# Patient Record
Sex: Female | Born: 2000 | Race: White | Hispanic: No | Marital: Single | Smoking: Current some day smoker
Health system: Southern US, Community
[De-identification: ages and names within clinical notes are randomized; demographics above are authoritative.]

## PROBLEM LIST (undated history)

## (undated) DIAGNOSIS — T7840XA Allergy, unspecified, initial encounter: Secondary | ICD-10-CM

## (undated) DIAGNOSIS — K219 Gastro-esophageal reflux disease without esophagitis: Secondary | ICD-10-CM

## (undated) DIAGNOSIS — F419 Anxiety disorder, unspecified: Secondary | ICD-10-CM

## (undated) DIAGNOSIS — F429 Obsessive-compulsive disorder, unspecified: Secondary | ICD-10-CM

## (undated) DIAGNOSIS — F17210 Nicotine dependence, cigarettes, uncomplicated: Secondary | ICD-10-CM

## (undated) DIAGNOSIS — J45909 Unspecified asthma, uncomplicated: Secondary | ICD-10-CM

## (undated) DIAGNOSIS — F32A Depression, unspecified: Secondary | ICD-10-CM

## (undated) DIAGNOSIS — G8929 Other chronic pain: Secondary | ICD-10-CM

## (undated) DIAGNOSIS — N9489 Other specified conditions associated with female genital organs and menstrual cycle: Secondary | ICD-10-CM

## (undated) DIAGNOSIS — F1210 Cannabis abuse, uncomplicated: Secondary | ICD-10-CM

## (undated) DIAGNOSIS — F1410 Cocaine abuse, uncomplicated: Secondary | ICD-10-CM

## (undated) DIAGNOSIS — R102 Pelvic and perineal pain unspecified side: Secondary | ICD-10-CM

## (undated) DIAGNOSIS — Z9151 Personal history of suicidal behavior: Secondary | ICD-10-CM

## (undated) DIAGNOSIS — F329 Major depressive disorder, single episode, unspecified: Secondary | ICD-10-CM

## (undated) DIAGNOSIS — F909 Attention-deficit hyperactivity disorder, unspecified type: Secondary | ICD-10-CM

## (undated) HISTORY — DX: Anxiety disorder, unspecified: F41.9

## (undated) HISTORY — DX: Allergy, unspecified, initial encounter: T78.40XA

## (undated) HISTORY — PX: NO PAST SURGERIES: SHX2092

---
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
## 2022-05-24 ENCOUNTER — Ambulatory Visit
Admission: EM | Admit: 2022-05-24 | Discharge: 2022-05-24 | Disposition: A | Discharge status: Home / Self Care | Payer: No Typology Code available for payment source | Source: Ambulatory Visit | Attending: Emergency Medicine | Admitting: Emergency Medicine

## 2022-05-24 ENCOUNTER — Other Ambulatory Visit: Payer: Self-pay

## 2022-05-24 ENCOUNTER — Emergency Department
Admission: EM | Admit: 2022-05-24 | Discharge: 2022-05-24 | Disposition: A | Discharge status: Home / Self Care | Payer: Medicaid Other | Attending: Emergency Medicine | Admitting: Emergency Medicine

## 2022-05-24 ENCOUNTER — Encounter: Payer: Self-pay | Admitting: Emergency Medicine

## 2022-05-24 DIAGNOSIS — T7421XA Adult sexual abuse, confirmed, initial encounter: Secondary | ICD-10-CM | POA: Insufficient documentation

## 2022-05-24 DIAGNOSIS — Z0441 Encounter for examination and observation following alleged adult rape: Secondary | ICD-10-CM | POA: Diagnosis present

## 2022-05-24 DIAGNOSIS — R000 Tachycardia, unspecified: Secondary | ICD-10-CM | POA: Diagnosis not present

## 2022-05-24 LAB — POC URINE PREG, ED: Preg Test, Ur: NEGATIVE

## 2022-05-24 MED ORDER — ULIPRISTAL ACETATE 30 MG PO TABS
30.0000 mg | ORAL_TABLET | Freq: Once | ORAL | Status: AC
  Administered 2022-05-24: 30 mg via ORAL
  Filled 2022-05-24: qty 1

## 2022-05-24 NOTE — ED Notes (Signed)
Patient's grandmother is at bedside. Patient given hospital discharge instructions and has signed discharge with SANE RN.
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
## 2022-05-24 NOTE — ED Notes (Addendum)
Medication given to and administered by Murrell Converse, SANE RN.
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
## 2022-07-16 LAB — OB RESULTS CONSOLE GC/CHLAMYDIA: Chlamydia: NEGATIVE

## 2022-08-28 ENCOUNTER — Other Ambulatory Visit: Payer: Self-pay

## 2022-08-28 ENCOUNTER — Emergency Department
Admission: EM | Admit: 2022-08-28 | Discharge: 2022-08-28 | Disposition: A | Discharge status: Home / Self Care | Payer: Medicaid Other | Attending: Emergency Medicine | Admitting: Emergency Medicine

## 2022-08-28 DIAGNOSIS — R109 Unspecified abdominal pain: Secondary | ICD-10-CM | POA: Diagnosis present

## 2022-08-28 DIAGNOSIS — Z5321 Procedure and treatment not carried out due to patient leaving prior to being seen by health care provider: Secondary | ICD-10-CM | POA: Diagnosis not present

## 2022-08-28 LAB — COMPREHENSIVE METABOLIC PANEL
ALT: 19 U/L (ref 0–44)
AST: 23 U/L (ref 15–41)
Albumin: 4.2 g/dL (ref 3.5–5.0)
Alkaline Phosphatase: 80 U/L (ref 38–126)
Anion gap: 10 (ref 5–15)
BUN: 11 mg/dL (ref 6–20)
CO2: 21 mmol/L — ABNORMAL LOW (ref 22–32)
Calcium: 8.9 mg/dL (ref 8.9–10.3)
Chloride: 108 mmol/L (ref 98–111)
Creatinine, Ser: 0.64 mg/dL (ref 0.44–1.00)
GFR, Estimated: >60 mL/min (ref >60)
Glucose, Bld: 96 mg/dL (ref 70–99)
Potassium: 4.2 mmol/L (ref 3.5–5.1)
Sodium: 139 mmol/L (ref 135–145)
Total Bilirubin: 0.5 mg/dL (ref 0.3–1.2)
Total Protein: 8 g/dL (ref 6.5–8.1)

## 2022-08-28 LAB — CBC
HCT: 42.2 % (ref 36.0–46.0)
Hemoglobin: 13.4 g/dL (ref 12.0–15.0)
MCH: 27.2 pg (ref 26.0–34.0)
MCHC: 31.8 g/dL (ref 30.0–36.0)
MCV: 85.6 fL (ref 80.0–100.0)
Platelets: 280 10*3/uL (ref 150–400)
RBC: 4.93 MIL/uL (ref 3.87–5.11)
RDW: 13.3 % (ref 11.5–15.5)
WBC: 6.3 10*3/uL (ref 4.0–10.5)
nRBC: 0 % (ref 0.0–0.2)

## 2022-08-28 LAB — URINALYSIS, ROUTINE W REFLEX MICROSCOPIC
Bilirubin Urine: NEGATIVE
Glucose, UA: NEGATIVE mg/dL
Hgb urine dipstick: NEGATIVE
Ketones, ur: NEGATIVE mg/dL
Leukocytes,Ua: NEGATIVE
Nitrite: NEGATIVE
Protein, ur: NEGATIVE mg/dL
Specific Gravity, Urine: 1.024 (ref 1.005–1.030)
pH: 5 (ref 5.0–8.0)

## 2022-08-28 LAB — POC URINE PREG, ED: Preg Test, Ur: NEGATIVE

## 2022-08-28 LAB — LIPASE, BLOOD: Lipase: 40 U/L (ref 11–51)

## 2022-08-28 NOTE — ED Notes (Signed)
Patient is refusing labs. Patient states "she is scared to get her blood drawn." & that she is tired. RN notified along with PA.

## 2022-08-28 NOTE — ED Provider Triage Note (Signed)
Emergency Medicine Provider Triage Evaluation Note  Karen Villa, a 21 y.o. female  was evaluated in triage.  Pt complains of gastric abdominal pain that started this morning.  Patient describes awakening at about 11 AM with some chest pressure abdominal discomfort.  She does admit to alcohol intake yesterday.  She denies any nausea or vomiting.  Review of Systems  Positive: Abd pain Negative: NVD  Physical Exam  BP 125/87 (BP Location: Left Arm)   Pulse 97   Temp 97.6 F (36.4 C) (Oral)   Resp 18   Ht 5\' 6"  (1.676 m)   Wt 68 kg   SpO2 100%   BMI 24.21 kg/m  Gen:   Awake, no distress  NAD Resp:  Normal effort CTA MSK:   Moves extremities without difficulty  Other:    Medical Decision Making  Medically screening exam initiated at 12:22 PM.  Appropriate orders placed.  Mariska L Rigsbee was informed that the remainder of the evaluation will be completed by another provider, this initial triage assessment does not replace that evaluation, and the importance of remaining in the ED until their evaluation is complete.  Patient to the ED for evaluation of abdominal pain with onset this morning.  Patient describes epigastric discomfort upon awakening.  She denies any frank nausea or vomiting.   [name], PA-C 08/28/22 1222
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
## 2023-01-16 ENCOUNTER — Emergency Department
Admission: EM | Admit: 2023-01-16 | Discharge: 2023-01-16 | Discharge status: Against Medical Advice | Payer: Medicaid Other | Attending: Emergency Medicine | Admitting: Emergency Medicine

## 2023-01-16 ENCOUNTER — Other Ambulatory Visit: Payer: Self-pay

## 2023-01-16 DIAGNOSIS — Z5321 Procedure and treatment not carried out due to patient leaving prior to being seen by health care provider: Secondary | ICD-10-CM | POA: Diagnosis not present

## 2023-01-16 DIAGNOSIS — R1032 Left lower quadrant pain: Secondary | ICD-10-CM | POA: Insufficient documentation

## 2023-01-16 LAB — CBC
HCT: 43.3 % (ref 36.0–46.0)
Hemoglobin: 14.1 g/dL (ref 12.0–15.0)
MCH: 27.7 pg (ref 26.0–34.0)
MCHC: 32.6 g/dL (ref 30.0–36.0)
MCV: 85.1 fL (ref 80.0–100.0)
Platelets: 241 10*3/uL (ref 150–400)
RBC: 5.09 MIL/uL (ref 3.87–5.11)
RDW: 13.3 % (ref 11.5–15.5)
WBC: 6.1 10*3/uL (ref 4.0–10.5)
nRBC: 0 % (ref 0.0–0.2)

## 2023-01-16 LAB — URINALYSIS, ROUTINE W REFLEX MICROSCOPIC
Bilirubin Urine: NEGATIVE
Glucose, UA: NEGATIVE mg/dL
Hgb urine dipstick: NEGATIVE
Ketones, ur: NEGATIVE mg/dL
Leukocytes,Ua: NEGATIVE
Nitrite: NEGATIVE
Protein, ur: NEGATIVE mg/dL
Specific Gravity, Urine: 1.021 (ref 1.005–1.030)
pH: 5 (ref 5.0–8.0)

## 2023-01-16 LAB — COMPREHENSIVE METABOLIC PANEL
ALT: 24 U/L (ref 0–44)
AST: 19 U/L (ref 15–41)
Albumin: 4.5 g/dL (ref 3.5–5.0)
Alkaline Phosphatase: 78 U/L (ref 38–126)
Anion gap: 7 (ref 5–15)
BUN: 8 mg/dL (ref 6–20)
CO2: 24 mmol/L (ref 22–32)
Calcium: 9.3 mg/dL (ref 8.9–10.3)
Chloride: 104 mmol/L (ref 98–111)
Creatinine, Ser: 0.68 mg/dL (ref 0.44–1.00)
GFR, Estimated: >60 mL/min (ref >60)
Glucose, Bld: 93 mg/dL (ref 70–99)
Potassium: 3.9 mmol/L (ref 3.5–5.1)
Sodium: 135 mmol/L (ref 135–145)
Total Bilirubin: 0.7 mg/dL (ref 0.3–1.2)
Total Protein: 7.9 g/dL (ref 6.5–8.1)

## 2023-01-16 LAB — POC URINE PREG, ED: Preg Test, Ur: NEGATIVE

## 2023-01-16 LAB — LIPASE, BLOOD: Lipase: 34 U/L (ref 11–51)
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
## 2023-03-25 ENCOUNTER — Emergency Department
Admission: EM | Admit: 2023-03-25 | Discharge: 2023-03-25 | Discharge status: Against Medical Advice | Payer: MEDICAID | Attending: Emergency Medicine | Admitting: Emergency Medicine

## 2023-03-25 DIAGNOSIS — Z5321 Procedure and treatment not carried out due to patient leaving prior to being seen by health care provider: Secondary | ICD-10-CM | POA: Diagnosis present

## 2023-03-25 NOTE — ED Notes (Signed)
Went out to get pt to bring back to triage, Engineer, materials states pt walked out and called for a ride and left.

## 2023-03-27 ENCOUNTER — Emergency Department: Payer: MEDICAID

## 2023-03-27 ENCOUNTER — Encounter: Payer: Self-pay | Admitting: Emergency Medicine

## 2023-03-27 ENCOUNTER — Emergency Department
Admission: EM | Admit: 2023-03-27 | Discharge: 2023-03-27 | Disposition: A | Discharge status: Home / Self Care | Payer: MEDICAID | Attending: Emergency Medicine | Admitting: Emergency Medicine

## 2023-03-27 DIAGNOSIS — R112 Nausea with vomiting, unspecified: Secondary | ICD-10-CM | POA: Insufficient documentation

## 2023-03-27 DIAGNOSIS — R102 Pelvic and perineal pain: Secondary | ICD-10-CM | POA: Insufficient documentation

## 2023-03-27 DIAGNOSIS — N9489 Other specified conditions associated with female genital organs and menstrual cycle: Secondary | ICD-10-CM

## 2023-03-27 LAB — URINALYSIS, ROUTINE W REFLEX MICROSCOPIC
Bacteria, UA: NONE SEEN
Bilirubin Urine: NEGATIVE
Glucose, UA: NEGATIVE mg/dL
Ketones, ur: 5 mg/dL — AB
Leukocytes,Ua: NEGATIVE
Nitrite: NEGATIVE
Protein, ur: 30 mg/dL — AB
Specific Gravity, Urine: 1.041 — ABNORMAL HIGH (ref 1.005–1.030)
pH: 5 (ref 5.0–8.0)

## 2023-03-27 LAB — HCG, QUANTITATIVE, PREGNANCY: hCG, Beta Chain, Quant, S: <1 m[IU]/mL (ref <5)

## 2023-03-27 LAB — COMPREHENSIVE METABOLIC PANEL
ALT: 20 U/L (ref 0–44)
AST: 22 U/L (ref 15–41)
Albumin: 4.8 g/dL (ref 3.5–5.0)
Alkaline Phosphatase: 102 U/L (ref 38–126)
Anion gap: 11 (ref 5–15)
BUN: 12 mg/dL (ref 6–20)
CO2: 22 mmol/L (ref 22–32)
Calcium: 9.2 mg/dL (ref 8.9–10.3)
Chloride: 105 mmol/L (ref 98–111)
Creatinine, Ser: 0.68 mg/dL (ref 0.44–1.00)
GFR, Estimated: >60 mL/min (ref >60)
Glucose, Bld: 118 mg/dL — ABNORMAL HIGH (ref 70–99)
Potassium: 3.4 mmol/L — ABNORMAL LOW (ref 3.5–5.1)
Sodium: 138 mmol/L (ref 135–145)
Total Bilirubin: 0.5 mg/dL (ref 0.3–1.2)
Total Protein: 8.6 g/dL — ABNORMAL HIGH (ref 6.5–8.1)

## 2023-03-27 LAB — CBC
HCT: 42.5 % (ref 36.0–46.0)
Hemoglobin: 13.7 g/dL (ref 12.0–15.0)
MCH: 27.8 pg (ref 26.0–34.0)
MCHC: 32.2 g/dL (ref 30.0–36.0)
MCV: 86.4 fL (ref 80.0–100.0)
Platelets: 303 10*3/uL (ref 150–400)
RBC: 4.92 MIL/uL (ref 3.87–5.11)
RDW: 12.9 % (ref 11.5–15.5)
WBC: 12.1 10*3/uL — ABNORMAL HIGH (ref 4.0–10.5)
nRBC: 0 % (ref 0.0–0.2)

## 2023-03-27 LAB — LIPASE, BLOOD: Lipase: 32 U/L (ref 11–51)

## 2023-03-27 MED ORDER — ONDANSETRON HCL 4 MG/2ML IJ SOLN: 4.0000 mg | Freq: Once | INTRAMUSCULAR | Status: DC

## 2023-03-27 MED ORDER — DROPERIDOL 2.5 MG/ML IJ SOLN
2.5000 mg | Freq: Once | INTRAMUSCULAR | Status: DC
  Filled 2023-03-27: qty 2

## 2023-03-27 MED ORDER — HYDROMORPHONE HCL 1 MG/ML IJ SOLN: 1.0000 mg | Freq: Once | INTRAMUSCULAR | Status: DC

## 2023-03-27 MED ORDER — OXYCODONE-ACETAMINOPHEN 5-325 MG PO TABS
1.0000 | ORAL_TABLET | Freq: Once | ORAL | Status: AC
  Administered 2023-03-27: 1 via ORAL
  Filled 2023-03-27: qty 1

## 2023-03-27 MED ORDER — ONDANSETRON 4 MG PO TBDP
4.0000 mg | ORAL_TABLET | Freq: Once | ORAL | Status: DC
  Filled 2023-03-27: qty 1

## 2023-03-27 MED ORDER — SODIUM CHLORIDE 0.9 % IV BOLUS: 1000.0000 mL | Freq: Once | INTRAVENOUS | Status: DC

## 2023-03-27 NOTE — ED Provider Notes (Signed)
[facility] Provider Note    Event Date/Time   First MD Initiated Contact with Patient 03/27/23 [phone]     (approximate)   History   Groin Pain and Abdominal Pain   HPI  Karen Villa is a 22 y.o. female brought to the ED via EMS from home with a chief complaint of left adnexal pain.  Patient reports pelvic pain monthly with her menstrual cycles.  Started her period on 03/25/2023.  Endorses associated nausea and vomiting.  Denies fever/chills, chest pain, shortness of breath, dysuria or diarrhea.  Denies prior workup with GYN.     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 No pertinent surgical history.   Home Medications   Prior to Admission medications   Medication Sig Start Date End Date Taking? Authorizing Provider  busPIRone (BUSPAR) 5 MG tablet Take 1 tablet (5 mg total) 2 (two) times daily by mouth. Patient not taking: Reported on 07/15/2020 08/08/17   Denzil Magnuson, NP  dicyclomine (BENTYL) 10 MG capsule Take 1 capsule (10 mg total) by mouth 3 (three) times daily as needed for up to 14 days (abdominal pain). 04/14/21 04/28/21  Phineas Semen, MD  loratadine (CLARITIN) 10 MG tablet Take 10 mg daily as needed by mouth for allergies. Patient not taking: Reported on 07/15/2020    [provider]  MELATONIN PO Take 1 tablet as needed by mouth (sleep). Patient not taking: Reported on 07/15/2020    [provider]  NATROBA 0.9 % SUSP Apply topically. 06/13/20    [provider]  sertraline (ZOLOFT) 50 MG tablet Take 1 tablet (50 mg total) daily by mouth. Patient not taking: Reported on 07/15/2020 08/08/17   Denzil Magnuson, NP     Allergies  Other, Shrimp [shellfish allergy], Blueberry flavor, and Albuterol   Family History   Family History  Problem Relation Age of Onset   Drug abuse Mother    Mental illness Mother    Mental illness Father    Mental illness Paternal Grandmother      Physical Exam  Triage Vital Signs: ED Triage Vitals  Enc Vitals Group     BP 03/27/23 0458 (!) 141/91     Pulse Rate 03/27/23 0458 (!) 104     Resp 03/27/23 0458 (!) 22     Temp 03/27/23 0458 98.2 F (36.8 C)     Temp Source 03/27/23 0458 Oral     SpO2 03/27/23 0458 99 %     Weight 03/27/23 0459 154 lb 5.2 oz (70 kg)     Height 03/27/23 0459 5\' 6"  (1.676 m)     Head Circumference --      Peak Flow --      Pain Score 03/27/23 0459 10     Pain Loc --      Pain Edu? --      Excl. in GC? --     Updated Vital Signs: BP (!) 141/91 (BP Location: Left Arm)  Pulse (!) 104   Temp 98.2 F (36.8 C) (Oral)   Resp (!) 22   Ht 5\' 6"  (1.676 m)   Wt 70 kg   SpO2 99%   BMI 24.91 kg/m    General: Awake, moderate distress.  Screaming in pain but distractible on interview and examination. CV:  RRR. Good peripheral perfusion.  Resp:  Normal effort. CTAB. Abd:  MIld left pelvic tenderness to palpation without rebound or guarding.  No distention.  Other:  No truncal vesicles.   ED Results / Procedures / Treatments  Labs (all labs ordered are listed, but only abnormal results are displayed) Labs Reviewed  COMPREHENSIVE METABOLIC PANEL - Abnormal; Notable for the following components:      Result Value   Potassium 3.4 (*)    Glucose, Bld 118 (*)    Total Protein 8.6 (*)    All other components within normal limits  CBC - Abnormal; Notable for the following components:   WBC 12.1 (*)    All other components within normal limits   URINALYSIS, ROUTINE W REFLEX MICROSCOPIC - Abnormal; Notable for the following components:   Color, Urine YELLOW (*)    APPearance HAZY (*)    Specific Gravity, Urine 1.041 (*)    Hgb urine dipstick LARGE (*)    Ketones, ur 5 (*)    Protein, ur 30 (*)    All other components within normal limits  LIPASE, BLOOD  POC URINE PREG, ED     EKG  None   RADIOLOGY Ultrasound is pending   Official radiology report(s): No results found.   PROCEDURES:  Critical Care performed: No  Procedures   MEDICATIONS ORDERED IN ED: Medications  sodium chloride 0.9 % bolus 1,000 mL (1,000 mLs Intravenous Patient Refused/Not Given 03/27/23 0630)  droperidol (INAPSINE) 2.5 MG/ML injection 2.5 mg (2.5 mg Intravenous Patient Refused/Not Given 03/27/23 0631)  oxyCODONE-acetaminophen (PERCOCET/ROXICET) 5-325 MG per tablet 1 tablet (has no administration in time range)     IMPRESSION / MDM / ASSESSMENT AND PLAN / ED COURSE  I reviewed the triage vital signs and the nursing notes.                             22 year old female presenting with left pelvic pain. Differential diagnosis includes, but is not limited to, ovarian cyst, ovarian torsion, acute appendicitis, diverticulitis, urinary tract infection/pyelonephritis, endometriosis, bowel obstruction, colitis, renal colic, gastroenteritis, hernia, fibroids, endometriosis, pregnancy related pain including ectopic pregnancy, etc. I have personally reviewed patient's records and note multiple ED visits for similar complaints and an urgent care visit from 09/25/2021 for vaginitis and STD screening.  Patient's presentation is most consistent with acute complicated illness / injury requiring diagnostic workup.  Will obtain lab work, pelvic ultrasound to evaluate for torsion.  Initiate IV fluid resuscitation, IV droperidol and reassess.  Clinical Course as of 03/27/23 0709  Thu Mar 27, 2023  0703 POCT negative.  Laboratory results largely unremarkable.   Patient currently in ultrasound.  Of note, patient refused IV and requested "Oxy". Care transferred to Dr. Rosalia Hammers at change of shift pending [place] results. Anticipate patient may be discharged home if ultrasound is unremarkable. [JS]    Clinical Course User Index [JS] Irean Hong, MD     FINAL CLINICAL IMPRESSION(S) / ED DIAGNOSES   Final diagnoses:  Pelvic pain in female     Rx / DC Orders   ED Discharge Orders  None        Note:  This document was prepared using Dragon voice recognition software and may include unintentional dictation errors.   Irean Hong, MD 03/27/23 [phone]

## 2023-03-27 NOTE — ED Notes (Signed)
POCT preg is negative

## 2023-03-27 NOTE — Discharge Instructions (Addendum)
You may take Tylenol and Ibuprofen as needed for pain.  Please arrange follow-up with OBGYN for further evaluation of your pain and to discuss your ultrasound findings. Return to the ER for worsening symptoms, persistent vomiting, difficulty breathing or other concerns.  Pelvic Ultrasound Interpretation:  IMPRESSION:  1. Indeterminate left adnexal mass measuring up to 2.1 cm. The  patient reportedly has a negative pregnancy test. Recommend  correlation with serum beta HCG to exclude the possibility of  underlying ectopic pregnancy. Assuming a negative beta HCG recommend  follow-up imaging in 3 months to ensure resolution.  2. No evidence of ovarian torsion.

## 2023-03-27 NOTE — ED Provider Notes (Signed)
Care of this patient assumed from prior physician at 0700 pending ultrasound, reevaluation, and disposition. Please see prior physician note for further details.  Briefly this is a 22 year old female who presents with left adnexal pain that has been ongoing for many years.  Has not seen gynecology as an outpatient for this.  Blood work overall reassuring here.  Negative UPT.  I was notified by radiology regarding her ultrasound as below.  This did demonstrate a left adnexal mass distinct from patient's ovary with concern for possible ectopic pregnancy.  Beta-hCG was obtained to for the rule out ectopic pregnancy which fortunately did return negative.  Patient was updated on the results of her workup.  Given longstanding nature of pain and absence of torsion on ultrasound, do think she is stable for outpatient follow-up.  She was given follow-up information with gynecology.  Strict return precautions were provided.  She was discharged in stable condition.   Clinical Course as of 03/27/23 0833  Thu Mar 27, 2023  1610 POCT negative.  Laboratory results largely unremarkable.  Patient currently in ultrasound.  Of note, patient refused IV and requested "Oxy". Care transferred to Dr. Rosalia Hammers at change of shift pending [place] results. Anticipate patient may be discharged home if ultrasound is unremarkable. [JS]  [id number] Notified by radiology that patient's pelvic ultrasound demonstrated a left adnexal mass distinct from her ovary.  He recommended to beta-hCG for further evaluation despite negative UPT for further confirmation that this is not an ectopic pregnancy.  This is been added onto prior blood work.  If this is negative, will plan for GYN follow-up, radiology recommends a 6-month repeat scan. [NR]    Clinical Course User Index [JS] Irean Hong, MD [NR] Trinna Post, MD      Trinna Post, MD 03/27/23 [phone]

## 2023-03-27 NOTE — ED Triage Notes (Signed)
Pt arrived via [facility] with c/o left lower abdominal pain that pt reports happens intermittently with menstrual cycles. Pt currently on cycle that began 7/2. N/V accompanying pain.

## 2023-04-22 ENCOUNTER — Ambulatory Visit (INDEPENDENT_AMBULATORY_CARE_PROVIDER_SITE_OTHER): Payer: MEDICAID | Admitting: Licensed Practical Nurse

## 2023-04-22 VITALS — BP 131/88 | HR 91 | Wt 151.3 lb

## 2023-04-22 DIAGNOSIS — Q505 Embryonic cyst of broad ligament: Secondary | ICD-10-CM

## 2023-04-22 NOTE — Progress Notes (Signed)
Here for ER follow up  Was seen on 7/4 for adnexal pain. An US showed: 1. Indeterminate left adnexal mass measuring up to 2.1 cm. The patient reportedly has a negative pregnancy test. Recommend correlation with serum beta HCG to exclude the possibility of underlying ectopic pregnancy. Assuming a negative beta HCG recommend follow-up imaging in 3 months to ensure resolution. 2. No evidence of ovarian torsion.    She was given pain medication and told to follow up with GYN  Niylah reports she has had this pain on and off for the last 2 years, the pain occurs about 5-10 hours after her period stops. It will last 24 hours and is severe. The pain tends to be on her Left or Right lower abdomen . She has tried Motrin or Aleve, and Midol. Pt cannot remember which medication she has actually taken she knows she "takes more than she should". She has been to the ED for pain management about 5 times, sometimes there is cyst seen on [place] other times nothing is seen. She will be given Percocet and pain improves. Prachi would like to know if she can have a script for something for when the pain comes back,   She is sexually active. She does not desire pregnancy. She is not doing anything to prevent pregnancy. She is resistant to medications. She "just does not like taking them".   She was recommended OCP's in the ED and does not want to take them.  She does smoke 5-10 cigarettes a week  Denies hx of CHTN or migraines  OBJECTIVE: BP 131/88   Pulse 91   Wt 151 lb 4.8 oz (68.6 kg)   BMI 24.42 kg/m  GEN: NAD, pt speaking in clear sentences but averting  eye contact and  PE not necessary for purpose of exam.  ASSESSMENT: ER follow up  Paraovarian cyst   PLAN:  Reviewed to decrease the occurrence of cysts hormonally treatment with  OCP with recommended, offered samples, reviewed risks of tobacco use and VTE, pt declines OCP.  Reviewed all options for contraception including non hormonal options. Encouraged  consistent condom use.   Pt may consider herbal remedies   If this pain were to occur again, encouraged pt to call office to be seen. This CNM in agreement with the ED's recommendation to not have a standing order for narcotics for pain.   Carie Caddy, CNM  [facility]  04/25/23  3:34 PM

## 2023-04-25 ENCOUNTER — Encounter: Payer: Self-pay | Admitting: Licensed Practical Nurse

## 2023-04-25 ENCOUNTER — Telehealth: Payer: Self-pay

## 2023-04-25 NOTE — Telephone Encounter (Signed)
Karen Villa, pt's grandmother, calling; has concerns; do not want to break any HIPA rules but needs to voice her concerns.  Karen Villa is an emergency contact; no DPR on file; she is a retired Engineer, civil (consulting).  She brought pt in earlier this week for ovarian pain and endometriosis; pt told Karen Villa that CNM did not touch her or do anything; nothing was even ordered; just told her to come back if worsens.   Pt has been seen in ER in [place] and at [facility].  I Alden Hipp that if blood work and u/s was done recently that is why nothing was ordered here.  Karen Villa said pt saw an RN/CNM whose name started with an "A".  Actually LMD saw pt. Karen Villa feels the pt should have been seen by a doctor; is really really disappointed in [place] but knows we are a good practice.  She did mention she guessed they would have to go to [facility]. Karen Villa was appreciative of me listening to her.

## 2023-07-14 ENCOUNTER — Encounter: Payer: Self-pay | Admitting: *Deleted

## 2023-07-14 ENCOUNTER — Emergency Department
Admission: EM | Admit: 2023-07-14 | Discharge: 2023-07-14 | Discharge status: Against Medical Advice | Payer: MEDICAID | Attending: Emergency Medicine | Admitting: Emergency Medicine

## 2023-07-14 ENCOUNTER — Other Ambulatory Visit: Payer: Self-pay

## 2023-07-14 DIAGNOSIS — R062 Wheezing: Secondary | ICD-10-CM | POA: Diagnosis not present

## 2023-07-14 DIAGNOSIS — R0602 Shortness of breath: Secondary | ICD-10-CM | POA: Insufficient documentation

## 2023-07-14 DIAGNOSIS — Z5321 Procedure and treatment not carried out due to patient leaving prior to being seen by health care provider: Secondary | ICD-10-CM | POA: Diagnosis not present

## 2023-07-14 NOTE — ED Notes (Signed)
Pt up to lobby desk "I think I am going to leave, its just causing a lot of arguments". Encouraged pt to f/u with a provider or return if she wishes.

## 2023-07-14 NOTE — ED Triage Notes (Signed)
Pt says she has had some SOB today. She says for about 5 months she has had bad allergies that only occur when she is in the current apartment she is in-at night, while she is asleep.  She says that she was wheezing, but that stopped once she left her apartment. Lung sounds clear, clear mucous cough.

## 2023-09-27 ENCOUNTER — Emergency Department
Admission: EM | Admit: 2023-09-27 | Discharge: 2023-09-27 | Disposition: A | Discharge status: Home / Self Care | Payer: MEDICAID | Attending: Emergency Medicine | Admitting: Emergency Medicine

## 2023-09-27 ENCOUNTER — Other Ambulatory Visit: Payer: Self-pay

## 2023-09-27 ENCOUNTER — Emergency Department: Payer: MEDICAID

## 2023-09-27 DIAGNOSIS — Z20822 Contact with and (suspected) exposure to covid-19: Secondary | ICD-10-CM | POA: Diagnosis not present

## 2023-09-27 DIAGNOSIS — J4521 Mild intermittent asthma with (acute) exacerbation: Secondary | ICD-10-CM | POA: Insufficient documentation

## 2023-09-27 DIAGNOSIS — R0602 Shortness of breath: Secondary | ICD-10-CM | POA: Diagnosis present

## 2023-09-27 LAB — RESP PANEL BY RT-PCR (RSV, FLU A&B, COVID)  RVPGX2
Influenza A by PCR: NEGATIVE
Influenza B by PCR: NEGATIVE
Resp Syncytial Virus by PCR: NEGATIVE
SARS Coronavirus 2 by RT PCR: NEGATIVE

## 2023-09-27 MED ORDER — LORATADINE 10 MG PO TABS: 10.0000 mg | ORAL_TABLET | Freq: Every day | ORAL | 3 refills | Status: DC | PRN

## 2023-09-27 MED ORDER — PREDNISONE 50 MG PO TABS: ORAL_TABLET | ORAL | 0 refills | Status: DC

## 2023-09-27 MED ORDER — ALBUTEROL SULFATE HFA 108 (90 BASE) MCG/ACT IN AERS: 2.0000 | INHALATION_SPRAY | Freq: Four times a day (QID) | RESPIRATORY_TRACT | 2 refills | Status: DC | PRN

## 2023-09-27 NOTE — ED Provider Notes (Signed)
 [facility] Provider Note    Event Date/Time   First MD Initiated Contact with Patient 09/27/23 [phone]     (approximate)   History   Shortness of Breath   HPI  Karen Villa is a 23 y.o. female with PMH of OCD, ADHD, depression, social anxiety and asthma presents for evaluation of shortness of breath that started last night.  Patient states that this happens at night a few times a month.  Sometimes she can take a warm bath or eat something hot and that helps her symptoms.  She is here today because it was not getting any better.  She has a history of asthma and used to use breathing treatments but has not done one in a very long time.  She denies URI symptoms including cough, congestion, sore throat and headache.      Physical Exam   Triage Vital Signs: ED Triage Vitals  Encounter Vitals Group     BP 09/27/23 0711 130/86     Systolic BP Percentile --      Diastolic BP Percentile --      Pulse Rate 09/27/23 0711 80     Resp 09/27/23 0711 20     Temp 09/27/23 0711 98 F (36.7 C)     Temp Source 09/27/23 0711 Oral     SpO2 09/27/23 0711 98 %     Weight --      Height --      Head Circumference --      Peak Flow --      Pain Score 09/27/23 0706 2     Pain Loc --      Pain Education --      Exclude from Growth Chart --     Most recent vital signs: Vitals:   09/27/23 0711  BP: 130/86  Pulse: 80  Resp: 20  Temp: 98 F (36.7 C)  SpO2: 98%   General: Awake, no distress.  CV:  Good peripheral perfusion.  RRR. Resp:  Normal effort.  Wheezing in right lower lung field.  Speaking in full sentences, tolerating secretions. Abd:  No distention.  Other:     ED Results / Procedures / Treatments   Labs (all labs ordered are listed, but only abnormal results are displayed) Labs Reviewed  RESP PANEL BY RT-PCR (RSV, FLU A&B, COVID)  [id number]   RADIOLOGY  Chest x-ray obtained, interpreted the images as well as reviewed the radiologist report which did  not show any acute cardiopulmonary abnormalities.   PROCEDURES:  Critical Care performed: No  Procedures   MEDICATIONS ORDERED IN ED: Medications - No data to display   IMPRESSION / MDM / ASSESSMENT AND PLAN / ED COURSE  I reviewed the triage vital signs and the nursing notes.                             23 year old female presents for evaluation of shortness of breath.  Vital signs are stable patient NAD on exam.  Differential diagnosis includes, but is not limited to, asthma exacerbation, flu, COVID, RSV, bronchitis.  Patient's presentation is most consistent with acute complicated illness / injury requiring diagnostic workup.  Respiratory panel was negative.  Chest x-ray was also negative.  Based on patient's history it sounds like she has mild intermittent  asthma.  She is not on any daily controller medications for this at this time and does not have a primary care provider.  I placed a referral for one as well as provided her with resources for offices to reach out to.  Patient has albuterol  listed in her allergies with the reaction being nausea and vomiting.  I believe benefits outweigh the risks so patient was sent an albuterol  inhaler.  I also gave her a few days of prednisone .  We discussed that if the albuterol  makes her nauseous or vomit to stop using it.  I emphasized the importance of getting established with a PCP so that she can be started on daily medications for this.  I also refilled her allergy medicine and we talked about how this could be a trigger.  She voiced understanding, all questions were answered and she was stable at discharge.     FINAL CLINICAL IMPRESSION(S) / ED DIAGNOSES   Final diagnoses:  Mild intermittent asthma with exacerbation     Rx / DC Orders   ED Discharge Orders          Ordered    albuterol  (VENTOLIN  HFA) 108 (90 Base) MCG/ACT inhaler  Every 6 hours PRN        09/27/23 0821    predniSONE  (DELTASONE ) 50 MG tablet         09/27/23 0821    loratadine  (CLARITIN ) 10 MG tablet  Daily PRN        09/27/23 0821    Ambulatory Referral to Primary Care (Establish Care)        09/27/23 9178             Note:  This document was prepared using Dragon voice recognition software and may include unintentional dictation errors.   Cleaster Tinnie LABOR, PA-C 09/27/23 9171    Bradler, Evan K, MD 09/27/23 1425

## 2023-09-27 NOTE — Discharge Instructions (Addendum)
 Take the claritin  daily.   Take the prednisone  once a day for 5 days. Use the albuterol  inhaler as needed. 2 puff every 6 hours. If you start to feel nauseous or vomit after using it, stop using it.   I have placed a referral for a primary care provider but there are also options below.   Please go to the following website to schedule new (and existing) patient appointments:   [email or web address]   The following is a list of primary care offices in the area who are accepting new patients at this time.  Please reach out to one of them directly and let them know you would like to schedule an appointment to follow up on an Emergency Department visit, and/or to establish a new primary care provider (PCP).  There are likely other primary care clinics in the are who are accepting new patients, but this is an excellent place to start:  [facility] Lead physician: Dr Jon Eva [address] [place], [place] [address] [phone]  [facility] Lead Physician: Dr Dorette Loron [address], [place], [place] [address] [phone]  [facility]  Lead Physician: Dr Duwaine Louder [address] [place], [place] [address] [phone]  [facility] Lead Physician: Dr Marolyn Officer [address] [place], [place] [address] [phone]  [facility] Primary Care & Sports Medicine at [facility] Lead Physician: Dr Leita Adie [address] [place], [place], [place] [address] [phone]

## 2023-09-27 NOTE — ED Triage Notes (Addendum)
 Pt to ED via POV from home. Pt ambulatory to triage. Pt reports SOB that started last pm and was still present this am but is not worse. Pt reports hx of asthma and anxiety. Pt denies CP. Pt reports did have emesis x1.

## 2023-11-27 ENCOUNTER — Ambulatory Visit: Payer: Self-pay

## 2023-11-27 NOTE — Telephone Encounter (Signed)
 Copied from CRM [id number]. Topic: Clinical - Red Word Triage >> Nov 27, 2023  4:30 PM Karen Villa wrote: Patient called in saying she has severe asthma   Chief Complaint: Difficulty breathing  Symptoms: Difficulty breathing  Frequency: Intermittent  Pertinent Negatives: Patient denies current shortness of breath  Disposition: Follow up with the ED or urgent care as needed until your new patient appointment  Additional Notes: Patient reports that she has been having increasingly frequent asthma attacks due to their pet dog. They report that they have been unable to re-home the dog and has been attempting to control her symptoms at home. Patient denies any current shortness of breath. Patient has a new patient appointment in April which has been added to the wait list in case an earlier appointment opens up. Patient has been advised to go to urgent care or the ED if needed. Patient verbalized understanding and agreement of this plan.    Reason for Disposition  [1] MILD longstanding difficulty breathing AND [2]  SAME as normal  Answer Assessment - Initial Assessment Questions 1. RESPIRATORY STATUS: "Describe your breathing?" (e.g., wheezing, shortness of breath, unable to speak, severe coughing)      Currently no difficulty  2. ONSET: "When did this breathing problem begin?"      Today  3. PATTERN "Does the difficult breathing come and go, or has it been constant since it started?"      Intermittent  4. SEVERITY: "How bad is your breathing?" (e.g., mild, moderate, severe)    - MILD: No SOB at rest, mild SOB with walking, speaks normally in sentences, can lie down, no retractions, pulse < 100.    - MODERATE: SOB at rest, SOB with minimal exertion and prefers to sit, cannot lie down flat, speaks in phrases, mild retractions, audible wheezing, pulse 100-120.    - SEVERE: Very SOB at rest, speaks in single words, struggling to breathe, sitting hunched forward, retractions, pulse > 120      Mild  5.  RECURRENT SYMPTOM: "Have you had difficulty breathing before?" If Yes, ask: "When was the last time?" and "What happened that time?"      Yes, history of asthma 6. CARDIAC HISTORY: "Do you have any history of heart disease?" (e.g., heart attack, angina, bypass surgery, angioplasty)      no 7. LUNG HISTORY: "Do you have any history of lung disease?"  (e.g., pulmonary embolus, asthma, emphysema)     Asthma  8. CAUSE: "What do you think is causing the breathing problem?"      New dog  9. OTHER SYMPTOMS: "Do you have any other symptoms? (e.g., dizziness, runny nose, cough, chest pain, fever)       No 11. PREGNANCY: "Is there any chance you are pregnant?" "When was your last menstrual period?"       No  Protocols used: Breathing Difficulty-A-AH

## 2023-12-30 ENCOUNTER — Encounter: Payer: Self-pay | Admitting: Physician Assistant

## 2023-12-30 ENCOUNTER — Ambulatory Visit (INDEPENDENT_AMBULATORY_CARE_PROVIDER_SITE_OTHER): Payer: MEDICAID | Admitting: Physician Assistant

## 2023-12-30 VITALS — BP 108/82 | HR 88 | Temp 97.9°F | Ht 66.0 in | Wt 162.0 lb

## 2023-12-30 DIAGNOSIS — J3081 Allergic rhinitis due to animal (cat) (dog) hair and dander: Secondary | ICD-10-CM | POA: Insufficient documentation

## 2023-12-30 DIAGNOSIS — J4540 Moderate persistent asthma, uncomplicated: Secondary | ICD-10-CM | POA: Insufficient documentation

## 2023-12-30 MED ORDER — MONTELUKAST SODIUM 10 MG PO TABS: 10.0000 mg | ORAL_TABLET | Freq: Every day | ORAL | 2 refills | Status: DC

## 2023-12-30 MED ORDER — FLUTICASONE-SALMETEROL 45-21 MCG/ACT IN AERO: 2.0000 | INHALATION_SPRAY | Freq: Two times a day (BID) | RESPIRATORY_TRACT | 12 refills | Status: DC

## 2023-12-30 MED ORDER — ALBUTEROL SULFATE HFA 108 (90 BASE) MCG/ACT IN AERS: 2.0000 | INHALATION_SPRAY | Freq: Four times a day (QID) | RESPIRATORY_TRACT | 11 refills | Status: DC | PRN

## 2023-12-30 NOTE — Patient Instructions (Signed)
-  It was a pleasure to see you today! Please review your visit summary for helpful information -I would encourage you to follow your care via MyChart where you can access lab results, notes, messages, and more -If you feel that we did a nice job today, please complete your after-visit survey and leave [place] a Google review! Your CMA today was Mariann Barter and your provider was Alvester Morin, PA-C, DMSc -Please return for follow-up in about 6 weeks

## 2023-12-30 NOTE — Assessment & Plan Note (Signed)
 Exacerbated by new dog. Patient aware that the foundation of allergy and asthma maintenance is trigger avoidance - will need to find a solution for the dog.   In the meantime, start maintenance inhaler. Also refilling albuterol for PRN use.   We discussed regularly grooming the dog and vacuuming 1-2 times per week. Advised to ensure her air filters have been replaced in the apartment.

## 2023-12-30 NOTE — Assessment & Plan Note (Signed)
 Montelukast might help some with both her allergies and asthma so we will start this. Continue oral antihistamine. Consider also intranasal corticosteroid.

## 2023-12-30 NOTE — Progress Notes (Signed)
 Date:  12/30/2023   Name:  Karen Villa   DOB:  09-Apr-2001   MRN:  [id number]   Chief Complaint: Establish Care, Asthma (No symptoms today ), and Allergies (alternates zyrtec, benadryl and Claritin)  Asthma This is a chronic problem. The current episode started more than 1 year ago. Episode frequency: comes and goes. Her past medical history is significant for asthma.   Karen Villa is a pleasant 23 year old female with a history of asthma who presents today to the clinic today to establish care.  Primary concern is addressing worsening asthma after adopting a husky.  She is quite certain that she is allergic to this dog's dander, but not her other dog.  She is trying to responsibly get rid of the dog, but struggling to find a shelter that can take it. She attempted to re-home it to another family but it was quickly returned to her.    She feels that her environmental allergies have been worse as well, perhaps as a result of the worsening baseline inflammation from the dog.  She has been alternating between antihistamines roughly every month, and has also been taking more than recommended.    She does not have a maintenance inhaler for her asthma, and uses albuterol 2 puffs about 2-3 times per day.  She says that before adopting the husky, her allergy and asthma symptoms were well-maintained with albuterol alone.    Medication list has been reviewed and updated.  Current Meds  Medication Sig   diphenhydrAMINE HCl (BENADRYL PO) Take by mouth.   fluticasone-salmeterol (ADVAIR HFA) 45-21 MCG/ACT inhaler Inhale 2 puffs into the lungs 2 (two) times daily. Always rinse mouth after each use.   MELATONIN PO Take 1 tablet by mouth as needed (sleep).   montelukast (SINGULAIR) 10 MG tablet Take 1 tablet (10 mg total) by mouth at bedtime.   [DISCONTINUED] albuterol (VENTOLIN HFA) 108 (90 Base) MCG/ACT inhaler Inhale 2 puffs into the lungs every 6 (six) hours as needed for wheezing or shortness of breath.    [DISCONTINUED] Cetirizine HCl (ZYRTEC PO) Take by mouth.   [DISCONTINUED] loratadine (CLARITIN) 10 MG tablet Take 1 tablet (10 mg total) by mouth daily as needed for allergies.     Review of Systems  Patient Active Problem List   Diagnosis Date Noted   Allergic rhinitis due to animal hair and dander 12/30/2023   Moderate persistent asthma without complication 12/30/2023   History of suicide attempt 03/17/2021   Depressive disorder 07/31/2018   Social anxiety disorder 07/28/2017    Allergies  Allergen Reactions   Other Other (See Comments)    Shrimp   Shrimp [Shellfish Allergy] Anaphylaxis   Blueberry Flavoring Agent (Non-Screening) Rash   Haloperidol Other (See Comments)    "I go crazy and feel restless for days"    Immunization History  Administered Date(s) Administered   HPV 9-valent 07/03/2015, 05/10/2016   Influenza,inj,Quad PF,6+ Mos 07/29/2017   Tdap 03/09/2014    History reviewed. No pertinent surgical history.  Social History   Tobacco Use   Smoking status: Some Days    Current packs/day: 0.25    Average packs/day: 0.3 packs/day for 6.3 years (1.6 ttl pk-yrs)    Types: Cigarettes    Start date: 2019   Smokeless tobacco: Never   Tobacco comments:    Pt stated "I smoke only like 5 times on the weekend."  Vaping Use   Vaping status: Never Used  Substance Use Topics   Alcohol use: Yes  Alcohol/week: 2.0 standard drinks of alcohol    Types: 1 Cans of beer, 1 Shots of liquor per week    Comment: ocassionally   Drug use: Not Currently    Types: Marijuana    Comment: acid    Family History  Problem Relation Age of Onset   Drug abuse Mother    Mental illness Mother    Mental illness Father    Hypertension Maternal Grandmother    Mental illness Paternal Grandmother         12/30/2023    8:32 AM  GAD 7 : Generalized Anxiety Score  Nervous, Anxious, on Edge 2  Control/stop worrying 0  Worry too much - different things 0  Trouble relaxing 0   Restless 0  Easily annoyed or irritable 2  Afraid - awful might happen 0  Total GAD 7 Score 4  Anxiety Difficulty Not difficult at all       12/30/2023    8:32 AM 12/30/2023    8:31 AM  Depression screen PHQ 2/9  Decreased Interest  0  Down, Depressed, Hopeless  0  PHQ - 2 Score  0  Altered sleeping 3   Tired, decreased energy 2   Change in appetite 0   Feeling bad or failure about yourself  0   Trouble concentrating 0   Moving slowly or fidgety/restless 0   Suicidal thoughts 0     BP Readings from Last 3 Encounters:  12/30/23 108/82  09/27/23 130/86  07/14/23 (!) 141/79    Wt Readings from Last 3 Encounters:  12/30/23 162 lb (73.5 kg)  07/14/23 150 lb (68 kg)  04/22/23 151 lb 4.8 oz (68.6 kg)    BP 108/82   Pulse 88   Temp 97.9 F (36.6 C)   Ht 5\' 6"  (1.676 m)   Wt 162 lb (73.5 kg)   SpO2 98%   BMI 26.15 kg/m   Physical Exam Vitals and nursing note reviewed.  Constitutional:      Appearance: Normal appearance.  Cardiovascular:     Rate and Rhythm: Normal rate and regular rhythm.     Heart sounds: No murmur heard.    No friction rub. No gallop.  Pulmonary:     Effort: Pulmonary effort is normal.     Breath sounds: Wheezing (mid-upper, bilateral, mild) present.  Abdominal:     General: There is no distension.  Musculoskeletal:        General: Normal range of motion.  Skin:    General: Skin is warm and dry.  Neurological:     Mental Status: She is alert and oriented to person, place, and time.     Gait: Gait is intact.  Psychiatric:        Mood and Affect: Mood and affect normal.     Recent Labs     Component Value Date/Time   NA 138 03/27/2023 0457   K 3.4 (L) 03/27/2023 0457   CL 105 03/27/2023 0457   CO2 22 03/27/2023 0457   GLUCOSE 118 (H) 03/27/2023 0457   BUN 12 03/27/2023 0457   CREATININE 0.68 03/27/2023 0457   CALCIUM 9.2 03/27/2023 0457   PROT 8.6 (H) 03/27/2023 0457   ALBUMIN 4.8 03/27/2023 0457   AST 22 03/27/2023 0457   ALT  20 03/27/2023 0457   ALKPHOS 102 03/27/2023 0457   BILITOT 0.5 03/27/2023 0457   GFRNONAA >60 03/27/2023 0457   GFRAA NOT CALCULATED 03/27/2020 0057    Lab Results  Component Value Date  WBC 12.1 (H) 03/27/2023   HGB 13.7 03/27/2023   HCT 42.5 03/27/2023   MCV 86.4 03/27/2023   PLT 303 03/27/2023   Lab Results  Component Value Date   HGBA1C 5.5 07/29/2017   Lab Results  Component Value Date   CHOL 154 07/30/2017   HDL 49 07/30/2017   LDLCALC 87 07/30/2017   TRIG 92 07/30/2017   CHOLHDL 3.1 07/30/2017   Lab Results  Component Value Date   TSH 2.395 07/29/2017     Assessment and Plan:  Moderate persistent asthma without complication Assessment & Plan: Exacerbated by new dog. Patient aware that the foundation of allergy and asthma maintenance is trigger avoidance - will need to find a solution for the dog.   In the meantime, start maintenance inhaler. Also refilling albuterol for PRN use.   We discussed regularly grooming the dog and vacuuming 1-2 times per week. Advised to ensure her air filters have been replaced in the apartment.   Orders: -     Albuterol Sulfate HFA; Inhale 2 puffs into the lungs every 6 (six) hours as needed for wheezing or shortness of breath.  Dispense: 8 g; Refill: 11 -     Montelukast Sodium; Take 1 tablet (10 mg total) by mouth at bedtime.  Dispense: 30 tablet; Refill: 2 -     Fluticasone-Salmeterol; Inhale 2 puffs into the lungs 2 (two) times daily. Always rinse mouth after each use.  Dispense: 1 each; Refill: 12  Allergic rhinitis due to animal hair and dander Assessment & Plan: Montelukast might help some with both her allergies and asthma so we will start this. Continue oral antihistamine. Consider also intranasal corticosteroid.   Orders: -     Montelukast Sodium; Take 1 tablet (10 mg total) by mouth at bedtime.  Dispense: 30 tablet; Refill: 2     Return in about 6 weeks (around 02/10/2024) for OV f/u chronic conditions.    Alvester Morin, PA-C, DMSc, Nutritionist [facility] Primary Care and Sports Medicine [facility] [phone]

## 2024-01-02 ENCOUNTER — Telehealth: Payer: Self-pay

## 2024-01-02 ENCOUNTER — Other Ambulatory Visit: Payer: Self-pay

## 2024-01-02 ENCOUNTER — Other Ambulatory Visit: Payer: Self-pay | Admitting: Physician Assistant

## 2024-01-02 DIAGNOSIS — J4540 Moderate persistent asthma, uncomplicated: Secondary | ICD-10-CM

## 2024-01-02 MED ORDER — VENTOLIN HFA 108 (90 BASE) MCG/ACT IN AERS: 2.0000 | INHALATION_SPRAY | Freq: Four times a day (QID) | RESPIRATORY_TRACT | 11 refills | Status: AC | PRN

## 2024-01-02 NOTE — Telephone Encounter (Signed)
 PA completed waiting on insurance approval.  Key: [id number]  [id number]

## 2024-01-02 NOTE — Telephone Encounter (Signed)
 Sent pt message on Mychart. PA needed for albuterol. Will complete.  KP Copied from CRM [id number]. Topic: Clinical - Prescription Issue >> Jan 01, 2024  3:41 PM Karen Villa wrote: Reason for CRM: Patient is calling because she states her inhalers are not over to her pharmacy. She said she was going to call back and check once more with them and if they don't have her prescription she will call back.

## 2024-01-02 NOTE — Telephone Encounter (Signed)
 PA not completed Dan sent in Ventolin.  KP

## 2024-02-08 ENCOUNTER — Emergency Department
Admission: EM | Admit: 2024-02-08 | Discharge: 2024-02-09 | Disposition: A | Discharge status: Home / Self Care | Payer: MEDICAID | Attending: Emergency Medicine | Admitting: Emergency Medicine

## 2024-02-08 ENCOUNTER — Other Ambulatory Visit: Payer: Self-pay

## 2024-02-08 DIAGNOSIS — R102 Pelvic and perineal pain: Secondary | ICD-10-CM | POA: Insufficient documentation

## 2024-02-08 LAB — BASIC METABOLIC PANEL WITH GFR
Anion gap: 8 (ref 5–15)
BUN: 12 mg/dL (ref 6–20)
CO2: 25 mmol/L (ref 22–32)
Calcium: 9.2 mg/dL (ref 8.9–10.3)
Chloride: 107 mmol/L (ref 98–111)
Creatinine, Ser: 0.76 mg/dL (ref 0.44–1.00)
GFR, Estimated: >60 mL/min (ref >60)
Glucose, Bld: 98 mg/dL (ref 70–99)
Potassium: 4 mmol/L (ref 3.5–5.1)
Sodium: 140 mmol/L (ref 135–145)

## 2024-02-08 LAB — CBC WITH DIFFERENTIAL/PLATELET
Abs Immature Granulocytes: 0.02 10*3/uL (ref 0.00–0.07)
Basophils Absolute: 0.1 10*3/uL (ref 0.0–0.1)
Basophils Relative: 1 %
Eosinophils Absolute: 0.5 10*3/uL (ref 0.0–0.5)
Eosinophils Relative: 4 %
HCT: 42 % (ref 36.0–46.0)
Hemoglobin: 13.7 g/dL (ref 12.0–15.0)
Immature Granulocytes: 0 %
Lymphocytes Relative: 26 %
Lymphs Abs: 2.8 10*3/uL (ref 0.7–4.0)
MCH: 27.6 pg (ref 26.0–34.0)
MCHC: 32.6 g/dL (ref 30.0–36.0)
MCV: 84.5 fL (ref 80.0–100.0)
Monocytes Absolute: 0.7 10*3/uL (ref 0.1–1.0)
Monocytes Relative: 7 %
Neutro Abs: 6.6 10*3/uL (ref 1.7–7.7)
Neutrophils Relative %: 62 %
Platelets: 278 10*3/uL (ref 150–400)
RBC: 4.97 MIL/uL (ref 3.87–5.11)
RDW: 13.1 % (ref 11.5–15.5)
WBC: 10.7 10*3/uL — ABNORMAL HIGH (ref 4.0–10.5)
nRBC: 0 % (ref 0.0–0.2)

## 2024-02-08 NOTE — ED Triage Notes (Signed)
 Pt repots left side groin pain, pt repots nausea and chills. Pt states this pain is normal before she is about to get her menstrual cycle, pt reports this time is worse. Pt currently denies vaginal bleeding.

## 2024-02-09 LAB — PREGNANCY, URINE: Preg Test, Ur: NEGATIVE

## 2024-02-09 LAB — URINALYSIS, ROUTINE W REFLEX MICROSCOPIC
Bilirubin Urine: NEGATIVE
Glucose, UA: NEGATIVE mg/dL
Hgb urine dipstick: NEGATIVE
Ketones, ur: NEGATIVE mg/dL
Leukocytes,Ua: NEGATIVE
Nitrite: NEGATIVE
Protein, ur: NEGATIVE mg/dL
Specific Gravity, Urine: 1.024 (ref 1.005–1.030)
pH: 5 (ref 5.0–8.0)

## 2024-02-09 MED ORDER — OXYCODONE HCL 5 MG PO TABS
5.0000 mg | ORAL_TABLET | ORAL | Status: AC
  Administered 2024-02-09: 5 mg via ORAL
  Filled 2024-02-09: qty 1

## 2024-02-09 MED ORDER — KETOROLAC TROMETHAMINE 15 MG/ML IJ SOLN
15.0000 mg | Freq: Once | INTRAMUSCULAR | Status: DC
  Filled 2024-02-09: qty 1

## 2024-02-09 MED ORDER — ACETAMINOPHEN 500 MG PO TABS
1000.0000 mg | ORAL_TABLET | Freq: Once | ORAL | Status: DC
  Filled 2024-02-09: qty 2

## 2024-02-09 NOTE — ED Provider Notes (Signed)
 [facility] Provider Note    Event Date/Time   First MD Initiated Contact with Patient 02/08/24 2342     (approximate)   History   Chief Complaint: Groin Pain   HPI  Karen Villa is a 23 y.o. female with a history of ADHD, depression who comes ED complaining of left lower quadrant abdominal pain today.  This is a recurrent issue which happens around her menstrual cycle.  No dysuria, no abnormal discharge.  No fever chest pain shortness of breath.  Has had multiple ED visits for similar symptoms in the past, negative workups previously.  Reports she was referred to gynecology, they have discussed doing a diagnostic laparoscopy with her, she has not followed up recently.       Past Medical History:  Diagnosis Date   ADHD    Allergy    Anxiety    Depression    OCD (obsessive compulsive disorder)     Current Outpatient Rx   Order #: [id number] Class: Historical Med   Order #: [id number] Class: Normal   Order #: [id number] Class: Historical Med   Order #: [id number] Class: Normal   Order #: [id number] Class: Normal    History reviewed. No pertinent surgical history.  Physical Exam   Triage Vital Signs: ED Triage Vitals  Encounter Vitals Group     BP 02/08/24 2134 (!) 131/96     Systolic BP Percentile --      Diastolic BP Percentile --      Pulse Rate 02/08/24 2134 95     Resp 02/08/24 2134 18     Temp 02/08/24 2134 98.6 F (37 C)     Temp Source 02/08/24 2134 Oral     SpO2 02/08/24 2134 100 %     Weight 02/08/24 2133 150 lb (68 kg)     Height 02/08/24 2133 5\' 6"  (1.676 m)     Head Circumference --      Peak Flow --      Pain Score 02/08/24 2133 10     Pain Loc --      Pain Education --      Exclude from Growth Chart --     Most recent vital signs: Vitals:   02/08/24 2134  BP: (!) 131/96  Pulse: 95  Resp: 18  Temp: 98.6 F (37 C)  SpO2: 100%    General: Awake, no distress.  CV:  Good peripheral perfusion.  Regular rate  rhythm Resp:  Normal effort.  Clear to auscultation Abd:  No distention.  Soft with mild suprapubic tenderness.  No CVA tenderness Other:  Occiput.  No rash   ED Results / Procedures / Treatments   Labs (all labs ordered are listed, but only abnormal results are displayed) Labs Reviewed  CBC WITH DIFFERENTIAL/PLATELET - Abnormal; Notable for the following components:      Result Value   WBC 10.7 (*)    All other components within normal limits  URINALYSIS, ROUTINE W REFLEX MICROSCOPIC - Abnormal; Notable for the following components:   Color, Urine YELLOW (*)    APPearance CLEAR (*)    All other components within normal limits  BASIC METABOLIC PANEL WITH GFR  PREGNANCY, URINE     EKG    RADIOLOGY    PROCEDURES:  Procedures   MEDICATIONS ORDERED IN ED: Medications  oxyCODONE  (Oxy IR/ROXICODONE ) immediate release tablet 5 mg (5 mg Oral Given 02/09/24 0159)     IMPRESSION / MDM / ASSESSMENT AND PLAN / ED COURSE  I  reviewed the triage vital signs and the nursing notes.  DDx: Cystitis, pregnancy, endometriosis, dysmenorrhea  Patient's presentation is most consistent with acute presentation with potential threat to life or bodily function.     Clinical Course as of 02/09/24 0349  Mon Feb 09, 2024  0013 Presents with severe pelvic cramping pain, has some suprapubic tenderness.  She has had multiple workups for the symptoms which are recurrent with her menstrual cycle.  Doubt PID TOA torsion.  She has had a visit with gynecology and has discussed diagnostic laparoscopy.  Symptoms are suggestive of endometriosis.  Labs are normal.  Will confirm pregnancy status, check UA.  NSAIDs for pain relief. [PS]  0139 Labs normal. Pt reports oxycodone  works best for her pain - will give a tablet, stable for DC to f/u with gyn. Has seen Dr. Nickey Barn office previously.  [PS]    Clinical Course User Index [PS] Jacquie Maudlin, MD     FINAL CLINICAL IMPRESSION(S) / ED  DIAGNOSES   Final diagnoses:  Pelvic pain in female     Rx / DC Orders   ED Discharge Orders     None        Note:  This document was prepared using Dragon voice recognition software and may include unintentional dictation errors.   Jacquie Maudlin, MD 02/09/24 [phone]

## 2024-02-10 ENCOUNTER — Ambulatory Visit (INDEPENDENT_AMBULATORY_CARE_PROVIDER_SITE_OTHER): Payer: MEDICAID | Admitting: Physician Assistant

## 2024-02-10 ENCOUNTER — Encounter: Payer: Self-pay | Admitting: Physician Assistant

## 2024-02-10 VITALS — BP 110/76 | HR 85 | Temp 98.3°F | Ht 66.0 in | Wt 167.0 lb

## 2024-02-10 DIAGNOSIS — J3081 Allergic rhinitis due to animal (cat) (dog) hair and dander: Secondary | ICD-10-CM | POA: Diagnosis not present

## 2024-02-10 DIAGNOSIS — R102 Pelvic and perineal pain unspecified side: Secondary | ICD-10-CM | POA: Insufficient documentation

## 2024-02-10 DIAGNOSIS — J4540 Moderate persistent asthma, uncomplicated: Secondary | ICD-10-CM

## 2024-02-10 MED ORDER — IBUPROFEN 800 MG PO TABS: 800.0000 mg | ORAL_TABLET | Freq: Three times a day (TID) | ORAL | 0 refills | Status: DC | PRN

## 2024-02-10 NOTE — Progress Notes (Signed)
 Date:  02/10/2024   Name:  Karen Villa   DOB:  09/23/2001   MRN:  [id number]   Chief Complaint: Medical Management of Chronic Issues  HPI Karen Villa presents for 6wk f/u on chronic conditions, namely asthma and allergies. She was not able to refill Advair last time due to insurance issue - we will follow up on this today. She has been using someone else's "red inhaler" in addition to her albuterol .  Has not tried montelukast  yet prescribed last visit.  Evaluated at ED on 02/08/24 for recurrent flare of abdominal/pelvic pain. Sounds like GYN is planning diagnostic laparoscopy to assess for endometriosis. Would like something to help with pain when this inevitably flares up again.    Medication list has been reviewed and updated.  Current Meds  Medication Sig   diphenhydrAMINE  HCl (BENADRYL  PO) Take by mouth.   ibuprofen  (ADVIL ) 800 MG tablet Take 1 tablet (800 mg total) by mouth every 8 (eight) hours as needed. Always take with food. Do not use with other NSAIDs (naproxen, aspirin, etc).   MELATONIN PO Take 1 tablet by mouth as needed (sleep).   montelukast  (SINGULAIR ) 10 MG tablet Take 1 tablet (10 mg total) by mouth at bedtime.   VENTOLIN  HFA 108 (90 Base) MCG/ACT inhaler Inhale 2 puffs into the lungs every 6 (six) hours as needed for wheezing or shortness of breath.     Review of Systems  Patient Active Problem List   Diagnosis Date Noted   Pelvic pain 02/10/2024   Allergic rhinitis due to animal hair and dander 12/30/2023   Moderate persistent asthma without complication 12/30/2023   History of suicide attempt 03/17/2021   Depressive disorder 07/31/2018   Social anxiety disorder 07/28/2017    Allergies  Allergen Reactions   Other Other (See Comments)    Shrimp   Shrimp [Shellfish Allergy] Anaphylaxis   Blueberry Flavoring Agent (Non-Screening) Rash   Haloperidol  Other (See Comments)    "I go crazy and feel restless for days"   Toradol  [Ketorolac  Tromethamine ] Itching     Immunization History  Administered Date(s) Administered   HPV 9-valent 07/03/2015, 05/10/2016   Influenza,inj,Quad PF,6+ Mos 07/29/2017   Tdap 03/09/2014    History reviewed. No pertinent surgical history.  Social History   Tobacco Use   Smoking status: Some Days    Current packs/day: 0.25    Average packs/day: 0.3 packs/day for 6.4 years (1.6 ttl pk-yrs)    Types: Cigarettes    Start date: 2019   Smokeless tobacco: Never   Tobacco comments:    Pt stated "I smoke only like 5 times on the weekend."  Vaping Use   Vaping status: Never Used  Substance Use Topics   Alcohol use: Yes    Alcohol/week: 2.0 standard drinks of alcohol    Types: 1 Cans of beer, 1 Shots of liquor per week    Comment: ocassionally   Drug use: Not Currently    Types: Marijuana    Comment: acid    Family History  Problem Relation Age of Onset   Drug abuse Mother    Mental illness Mother    Mental illness Father    Hypertension Maternal Grandmother    Mental illness Paternal Grandmother         02/10/2024   10:29 AM 12/30/2023    8:32 AM  GAD 7 : Generalized Anxiety Score  Nervous, Anxious, on Edge 0 2  Control/stop worrying 0 0  Worry too much - different things  0 0  Trouble relaxing 0 0  Restless 0 0  Easily annoyed or irritable 0 2  Afraid - awful might happen 0 0  Total GAD 7 Score 0 4  Anxiety Difficulty Not difficult at all Not difficult at all       02/10/2024   10:29 AM 12/30/2023    8:32 AM 12/30/2023    8:31 AM  Depression screen PHQ 2/9  Decreased Interest 0  0  Down, Depressed, Hopeless 0  0  PHQ - 2 Score 0  0  Altered sleeping  3   Tired, decreased energy  2   Change in appetite  0   Feeling bad or failure about yourself   0   Trouble concentrating  0   Moving slowly or fidgety/restless  0   Suicidal thoughts  0     BP Readings from Last 3 Encounters:  02/10/24 110/76  02/08/24 (!) 131/96  12/30/23 108/82    Wt Readings from Last 3 Encounters:  02/10/24 167  lb (75.8 kg)  02/08/24 150 lb (68 kg)  12/30/23 162 lb (73.5 kg)    BP 110/76   Pulse 85   Temp 98.3 F (36.8 C)   Ht 5\' 6"  (1.676 m)   Wt 167 lb (75.8 kg)   SpO2 97%   BMI 26.95 kg/m   Physical Exam Vitals and nursing note reviewed.  Constitutional:      Appearance: Normal appearance.  Cardiovascular:     Rate and Rhythm: Normal rate.  Pulmonary:     Effort: Pulmonary effort is normal.  Abdominal:     General: There is no distension.  Musculoskeletal:        General: Normal range of motion.  Skin:    General: Skin is warm and dry.  Neurological:     Mental Status: She is alert and oriented to person, place, and time.     Gait: Gait is intact.  Psychiatric:        Mood and Affect: Mood and affect normal.     Recent Labs     Component Value Date/Time   NA 140 02/08/2024 2135   K 4.0 02/08/2024 2135   CL 107 02/08/2024 2135   CO2 25 02/08/2024 2135   GLUCOSE 98 02/08/2024 2135   BUN 12 02/08/2024 2135   CREATININE 0.76 02/08/2024 2135   CALCIUM 9.2 02/08/2024 2135   PROT 8.6 (H) 03/27/2023 0457   ALBUMIN 4.8 03/27/2023 0457   AST 22 03/27/2023 0457   ALT 20 03/27/2023 0457   ALKPHOS 102 03/27/2023 0457   BILITOT 0.5 03/27/2023 0457   GFRNONAA >60 02/08/2024 2135   GFRAA NOT CALCULATED 03/27/2020 0057    Lab Results  Component Value Date   WBC 10.7 (H) 02/08/2024   HGB 13.7 02/08/2024   HCT 42.0 02/08/2024   MCV 84.5 02/08/2024   PLT 278 02/08/2024   Lab Results  Component Value Date   HGBA1C 5.5 07/29/2017   Lab Results  Component Value Date   CHOL 154 07/30/2017   HDL 49 07/30/2017   LDLCALC 87 07/30/2017   TRIG 92 07/30/2017   CHOLHDL 3.1 07/30/2017   Lab Results  Component Value Date   TSH 2.395 07/29/2017     Assessment and Plan:  Moderate persistent asthma without complication Assessment & Plan: Will follow up on Advair prescription to identify what needs to be done for her to fill this medication.    Allergic rhinitis due  to animal hair and  dander Assessment & Plan: Encouraged to try montelukast .    Pelvic pain Assessment & Plan: Sending ibuprofen  800 mg as below for PRN use  Orders: -     Ibuprofen ; Take 1 tablet (800 mg total) by mouth every 8 (eight) hours as needed. Always take with food. Do not use with other NSAIDs (naproxen, aspirin, etc).  Dispense: 30 tablet; Refill: 0     Return in about 6 months (around 08/12/2024) for OV f/u chronic conditions.    Cody Das, PA-C, DMSc, Nutritionist [facility] [phone]

## 2024-02-10 NOTE — Assessment & Plan Note (Signed)
 Sending ibuprofen  800 mg as below for PRN use

## 2024-02-10 NOTE — Assessment & Plan Note (Signed)
 Encouraged to try montelukast .

## 2024-02-10 NOTE — Assessment & Plan Note (Signed)
 Will follow up on Advair prescription to identify what needs to be done for her to fill this medication.

## 2024-04-15 ENCOUNTER — Ambulatory Visit: Payer: Self-pay

## 2024-04-15 NOTE — Telephone Encounter (Addendum)
 3rd attempt, lvmtcb. Per workflow routing to practice for no contact follow up.  2nd attempt, lvmtcb 1st attempt. lvmtcb  Message from [place] sent at 04/15/2024 10:37 AM EDT  pt said allergies are getting worse wants to know if she can get a allergy test done. Please follow up with pt [phone]

## 2024-04-15 NOTE — Telephone Encounter (Signed)
 Called pt left VM to call back.  KP

## 2024-04-15 NOTE — Telephone Encounter (Signed)
 Reason for Disposition . Message left on unidentified voice mail. Phone number verified.    X3  Protocols used: No Contact or Duplicate Contact Call-A-AH

## 2024-04-16 NOTE — Telephone Encounter (Signed)
 Called pt left VM to call back.  KP

## 2024-06-28 ENCOUNTER — Other Ambulatory Visit: Payer: Self-pay | Admitting: Physician Assistant

## 2024-06-28 DIAGNOSIS — J3081 Allergic rhinitis due to animal (cat) (dog) hair and dander: Secondary | ICD-10-CM

## 2024-06-28 DIAGNOSIS — J4540 Moderate persistent asthma, uncomplicated: Secondary | ICD-10-CM

## 2024-06-29 NOTE — Telephone Encounter (Signed)
 Requested Prescriptions  Pending Prescriptions Disp Refills   montelukast  (SINGULAIR ) 10 MG tablet [Pharmacy Med Name: MONTELUKAST  SOD 10 MG TABLET] 90 tablet 1    Sig: TAKE 1 TABLET BY MOUTH EVERYDAY AT BEDTIME     Pulmonology:  Leukotriene Inhibitors Passed - 06/29/2024 12:44 PM      Passed - Valid encounter within last 12 months    Recent Outpatient Visits           4 months ago Moderate persistent asthma without complication   [facility] at [facility], [place]   6 months ago Moderate persistent asthma without complication   [facility] at [facility], Toribio SQUIBB, [place]

## 2024-07-03 ENCOUNTER — Emergency Department
Admission: EM | Admit: 2024-07-03 | Discharge: 2024-07-03 | Disposition: A | Discharge status: Home / Self Care | Payer: MEDICAID | Attending: Emergency Medicine | Admitting: Emergency Medicine

## 2024-07-03 ENCOUNTER — Other Ambulatory Visit: Payer: Self-pay

## 2024-07-03 ENCOUNTER — Encounter: Payer: Self-pay | Admitting: Emergency Medicine

## 2024-07-03 DIAGNOSIS — R1032 Left lower quadrant pain: Secondary | ICD-10-CM | POA: Diagnosis present

## 2024-07-03 LAB — CBC
HCT: 39.5 % (ref 36.0–46.0)
Hemoglobin: 13 g/dL (ref 12.0–15.0)
MCH: 27.7 pg (ref 26.0–34.0)
MCHC: 32.9 g/dL (ref 30.0–36.0)
MCV: 84.2 fL (ref 80.0–100.0)
Platelets: 268 K/uL (ref 150–400)
RBC: 4.69 MIL/uL (ref 3.87–5.11)
RDW: 13.1 % (ref 11.5–15.5)
WBC: 6.3 K/uL (ref 4.0–10.5)
nRBC: 0 % (ref 0.0–0.2)

## 2024-07-03 LAB — COMPREHENSIVE METABOLIC PANEL WITH GFR
ALT: 17 U/L (ref 0–44)
AST: 23 U/L (ref 15–41)
Albumin: 4.2 g/dL (ref 3.5–5.0)
Alkaline Phosphatase: 78 U/L (ref 38–126)
Anion gap: 10 (ref 5–15)
BUN: 12 mg/dL (ref 6–20)
CO2: 22 mmol/L (ref 22–32)
Calcium: 8.9 mg/dL (ref 8.9–10.3)
Chloride: 108 mmol/L (ref 98–111)
Creatinine, Ser: 0.59 mg/dL (ref 0.44–1.00)
GFR, Estimated: >60 mL/min (ref >60)
Glucose, Bld: 102 mg/dL — ABNORMAL HIGH (ref 70–99)
Potassium: 4 mmol/L (ref 3.5–5.1)
Sodium: 140 mmol/L (ref 135–145)
Total Bilirubin: 0.3 mg/dL (ref 0.0–1.2)
Total Protein: 7.8 g/dL (ref 6.5–8.1)

## 2024-07-03 LAB — URINALYSIS, ROUTINE W REFLEX MICROSCOPIC
Bilirubin Urine: NEGATIVE
Glucose, UA: NEGATIVE mg/dL
Hgb urine dipstick: NEGATIVE
Ketones, ur: NEGATIVE mg/dL
Leukocytes,Ua: NEGATIVE
Nitrite: NEGATIVE
Protein, ur: NEGATIVE mg/dL
Specific Gravity, Urine: 1.029 (ref 1.005–1.030)
pH: 5 (ref 5.0–8.0)

## 2024-07-03 LAB — POC URINE PREG, ED: Preg Test, Ur: NEGATIVE

## 2024-07-03 LAB — LIPASE, BLOOD: Lipase: 34 U/L (ref 11–51)

## 2024-07-03 MED ORDER — MORPHINE SULFATE (PF) 4 MG/ML IV SOLN
4.0000 mg | Freq: Once | INTRAVENOUS | Status: AC
  Administered 2024-07-03: 4 mg via INTRAVENOUS
  Filled 2024-07-03: qty 1

## 2024-07-03 MED ORDER — SODIUM CHLORIDE 0.9 % IV BOLUS
1000.0000 mL | Freq: Once | INTRAVENOUS | Status: AC
  Administered 2024-07-03: 1000 mL via INTRAVENOUS

## 2024-07-03 MED ORDER — ALPRAZOLAM 0.5 MG PO TABS
0.5000 mg | ORAL_TABLET | Freq: Once | ORAL | Status: DC
  Filled 2024-07-03: qty 1

## 2024-07-03 MED ORDER — OXYCODONE-ACETAMINOPHEN 5-325 MG PO TABS: 1.0000 | ORAL_TABLET | Freq: Four times a day (QID) | ORAL | 0 refills | Status: AC | PRN

## 2024-07-03 MED ORDER — DROPERIDOL 2.5 MG/ML IJ SOLN
2.5000 mg | Freq: Once | INTRAMUSCULAR | Status: DC
  Filled 2024-07-03: qty 2

## 2024-07-03 MED ORDER — ALPRAZOLAM 0.5 MG PO TABS
1.0000 mg | ORAL_TABLET | Freq: Once | ORAL | Status: AC
  Administered 2024-07-03: 1 mg via ORAL
  Filled 2024-07-03: qty 2

## 2024-07-03 NOTE — Discharge Instructions (Signed)
 Your blood work and urinalysis were reassuring today.  You can take 650 mg of Tylenol  and 600 mg of ibuprofen  every 6 hours as needed for pain. You can use ice, heat, muscle creams and other topical pain relievers as well.  I have sent a strong pain medication called Percocet to the pharmacy.  This medication can be taken every 4-6 hours as needed for severe or breakthrough pain.  This medication can cause dependency so only take if you are unable to control your pain with other medications.  It will make you sleepy so do not drive or operate heavy machinery after taking it.  Do not drink alcohol while taking this medication.  This medication can also cause constipation so please take an over-the-counter stool softener like MiraLAX or Colace while taking it.  This medication contains both oxycodone  and acetaminophen .  Do not take Tylenol  at the same time.  You can take the Percocet or Tylenol  but not both.  Please follow-up with Dr. Leonce later this week for surgery.  Return to the emergency department with any worsening symptoms.

## 2024-07-03 NOTE — ED Provider Notes (Signed)
 [facility] Provider Note    Event Date/Time   First MD Initiated Contact with Patient 07/03/24 1519     (approximate)   History   Abdominal Pain   HPI  Karen Villa is a 23 y.o. female with PMH of OCD, ADHD, depression, anxiety who presents for evaluation of left lower quadrant abdominal pain over the past 3 days.  Patient reports this has been an ongoing issue for 4 years.  She was evaluated by OB/GYN Dr. Leonce on 10/7 who planned for exploratory lap on 10/16 to further evaluate patient's pain.  Patient reports today she came to the emergency department because she is unable to get the pain under control at home.      Physical Exam   Triage Vital Signs: ED Triage Vitals  Encounter Vitals Group     BP 07/03/24 1450 (!) 133/91     Girls Systolic BP Percentile --      Girls Diastolic BP Percentile --      Boys Systolic BP Percentile --      Boys Diastolic BP Percentile --      Pulse Rate 07/03/24 1450 97     Resp 07/03/24 1450 18     Temp 07/03/24 1450 98 F (36.7 C)     Temp Source 07/03/24 1450 Oral     SpO2 07/03/24 1450 100 %     Weight 07/03/24 1448 165 lb (74.8 kg)     Height 07/03/24 1448 5' 7 (1.702 m)     Head Circumference --      Peak Flow --      Pain Score 07/03/24 1448 10     Pain Loc --      Pain Education --      Exclude from Growth Chart --     Most recent vital signs: Vitals:   07/03/24 1450  BP: (!) 133/91  Pulse: 97  Resp: 18  Temp: 98 F (36.7 C)  SpO2: 100%    General: Awake, tearful, crying out, very uncomfortable appearing CV:  Good peripheral perfusion. RRR. Resp:  Normal effort. CTAB. Abd:  No distention.  Soft, nontender to palpation. Other:     ED Results / Procedures / Treatments   Labs (all labs ordered are listed, but only abnormal results are displayed) Labs Reviewed  COMPREHENSIVE METABOLIC PANEL WITH GFR - Abnormal; Notable for the following components:      Result Value   Glucose, Bld  102 (*)    All other components within normal limits  URINALYSIS, ROUTINE W REFLEX MICROSCOPIC - Abnormal; Notable for the following components:   Color, Urine YELLOW (*)    APPearance HAZY (*)    All other components within normal limits  LIPASE, BLOOD  CBC  POC URINE PREG, ED    PROCEDURES:  Critical Care performed: No  Procedures   MEDICATIONS ORDERED IN ED: Medications  sodium chloride  0.9 % bolus 1,000 mL (1,000 mLs Intravenous New Bag/Given 07/03/24 1607)  morphine (PF) 4 MG/ML injection 4 mg (4 mg Intravenous Given 07/03/24 1602)  ALPRAZolam (XANAX) tablet 1 mg (1 mg Oral Given 07/03/24 1634)     IMPRESSION / MDM / ASSESSMENT AND PLAN / ED COURSE  I reviewed the triage vital signs and the nursing notes.                             23 year old female presents for evaluation of left lower  abdominal pain.  Vital signs stable, patient was very uncomfortable on initial assessment.  Differential diagnosis includes, but is not limited to, acute on chronic abdominal pain, endometriosis, less likely diverticulitis, tubo-ovarian abscess, ovarian torsion, PID.  Patient's presentation is most consistent with acute complicated illness / injury requiring diagnostic workup.  Will obtain labs, urinalysis and pregnancy test.  If workup is reassuring we will plan to hold off on imaging at this point.  Physical exam is reassuring and that she does not have tenderness to palpation. Patient has had several imaging studies and previous workups for this same pain.  Last ultrasound showed an indeterminant left adnexal mass measuring up to 2.1 cm.  Patient reports that this pain feels the same as previous episodes of her pain. Do not feel that repeat imaging would provide new information.  Patient is already scheduled for exploratory lap next week.  Will plan to start IV and give patient IV pain medication to get her pain under control.   Clinical Course as of 07/03/24 1714  Sat Jul 03, 2024   1553 CBC Within normal limits. [LD]  1554 Comprehensive metabolic panel(!) Within normal limits. [LD]  1554 Lipase, blood Within normal limits. [LD]  1554 POC urine preg, ED Negative, no concern for ectopic pregnancy. [LD]  1610 Urinalysis, Routine w reflex microscopic -Urine, Clean Catch(!) Unremarkable. [LD]  [id number] Patient is very anxious about having the IV placed. Tearful in the room, will give patient some xanax. [LD]  1707 Patient reassessed and is feeling much better. [LD]  1712 Will plan to send patient with pain medication.  Advised her to follow-up with the OB/GYN later this week.  Patient was given reassurance regarding anxiety around her upcoming surgery.  She voiced understanding, all questions were answered and she was stable at discharge. [LD]    Clinical Course User Index [LD] Cleaster Tinnie LABOR, PA-C     FINAL CLINICAL IMPRESSION(S) / ED DIAGNOSES   Final diagnoses:  LLQ pain     Rx / DC Orders   ED Discharge Orders          Ordered    oxyCODONE -acetaminophen  (PERCOCET) 5-325 MG tablet  Every 6 hours PRN        07/03/24 1713             Note:  This document was prepared using Dragon voice recognition software and may include unintentional dictation errors.   Cleaster Tinnie LABOR, PA-C 07/03/24 1714    Floy Roberts, MD 07/03/24 [phone]

## 2024-07-03 NOTE — ED Triage Notes (Signed)
 Pt via POV from home. Pt c/o L lower abd pain for the past 3 days, pt has been having ongoing issues  for 4 years and she scheduled for a exploratory laparoscopy on the 16th with Dr. Leonce to look at her fallopian tubes. Reports nausea and dysuria. Denies fever. Pt is A&OX4 and NAD.

## 2024-07-07 ENCOUNTER — Other Ambulatory Visit: Payer: Self-pay

## 2024-07-07 ENCOUNTER — Encounter
Admission: RE | Admit: 2024-07-07 | Discharge: 2024-07-07 | Disposition: A | Discharge status: Home / Self Care | Payer: MEDICAID | Source: Ambulatory Visit | Attending: Obstetrics and Gynecology | Admitting: Obstetrics and Gynecology

## 2024-07-07 DIAGNOSIS — Z01818 Encounter for other preprocedural examination: Secondary | ICD-10-CM

## 2024-07-07 HISTORY — DX: Personal history of suicidal behavior: Z91.51

## 2024-07-07 HISTORY — DX: Other specified conditions associated with female genital organs and menstrual cycle: N94.89

## 2024-07-07 HISTORY — DX: Unspecified asthma, uncomplicated: J45.909

## 2024-07-07 HISTORY — DX: Nicotine dependence, cigarettes, uncomplicated: F17.210

## 2024-07-07 HISTORY — DX: Gastro-esophageal reflux disease without esophagitis: K21.9

## 2024-07-07 HISTORY — DX: Cannabis abuse, uncomplicated: F12.10

## 2024-07-07 HISTORY — DX: Pelvic and perineal pain unspecified side: R10.20

## 2024-07-07 HISTORY — DX: Cocaine abuse, uncomplicated: F14.10

## 2024-07-07 HISTORY — DX: Other chronic pain: G89.29

## 2024-07-07 NOTE — Patient Instructions (Addendum)
 Your procedure is scheduled on:07-08-24 Thursday Report to the Registration Desk on the 1st floor of the Medical Mall.Then proceed to the 2nd floor Surgery Desk To find out your arrival time, please call [phone] between 1PM - 3PM on:07-07-24 Wednesday If your arrival time is 6:00 am, do not arrive before that time as the Medical Mall entrance doors do not open until 6:00 am.  REMEMBER: Instructions that are not followed completely may result in serious medical risk, up to and including death; or upon the discretion of your surgeon and anesthesiologist your surgery may need to be rescheduled.  Do not eat food after midnight the night before surgery.  No gum chewing or hard candies.  You may however, drink CLEAR liquids up to 2 hours before you are scheduled to arrive for your surgery. Do not drink anything within 2 hours of your scheduled arrival time.  Clear liquids include: - water  - apple juice without pulp - gatorade (not RED colors) - black coffee or tea (Do NOT add milk or creamers to the coffee or tea) Do NOT drink anything that is not on this list.  In addition, your doctor has ordered for you to drink the provided:  Ensure Pre-Surgery Clear Carbohydrate Drink  Drinking this carbohydrate drink up to two hours before surgery helps to reduce insulin resistance and improve patient outcomes. Please complete drinking 2 hours before scheduled arrival time.  One week prior to surgery:Stop NOW (07-07-24) Stop Anti-inflammatories (NSAIDS) such as Advil , Aleve, Ibuprofen , Motrin , Naproxen, Naprosyn and Aspirin based products such as Excedrin, Goody's Powder, BC Powder. Stop ANY OVER THE COUNTER supplements until after surgery (Melatonin)  You may however, continue to take Tylenol  if needed for pain up until the day of surgery.  Continue taking all of your other prescription medications up until the day of surgery.  ON THE DAY OF SURGERY ONLY TAKE THESE MEDICATIONS WITH SIPS OF  WATER: -You may take oxyCODONE -acetaminophen  (PERCOCET) for pain if needed  Use your Advair Inhaler the day of surgery and bring your Ventolin  Inhaler to the hospital  No Alcohol for 24 hours before or after surgery.  No Smoking including e-cigarettes for 24 hours before surgery.  No chewable tobacco products for at least 6 hours before surgery.  No nicotine  patches on the day of surgery.  Do not use any recreational drugs for at least a week (preferably 2 weeks) before your surgery.  Please be advised that the combination of cocaine and anesthesia may have negative outcomes, up to and including death. If you test positive for cocaine, your surgery will be cancelled.  On the morning of surgery brush your teeth with toothpaste and water, you may rinse your mouth with mouthwash if you wish. Do not swallow any toothpaste or mouthwash.  Use CHG Soap as directed on instruction sheet.  Do not wear jewelry, make-up, hairpins, clips or nail polish.  For welded (permanent) jewelry: bracelets, anklets, waist bands, etc.  Please have this removed prior to surgery.  If it is not removed, there is a chance that hospital personnel will need to cut it off on the day of surgery.  Do not wear lotions, powders, or perfumes.   Do not shave body hair from the neck down 48 hours before surgery.  Contact lenses, hearing aids and dentures may not be worn into surgery.  Do not bring valuables to the hospital. [facility] is not responsible for any missing/lost belongings or valuables. SABRA Culver your doctor  if there is any change in your medical condition (cold, fever, infection).  Wear comfortable clothing (specific to your surgery type) to the hospital.  After surgery, you can help prevent lung complications by doing breathing exercises.  Take deep breaths and cough every 1-2 hours. Your doctor may order a device called an Incentive Spirometer to help you take deep breaths. When coughing or  sneezing, hold a pillow firmly against your incision with both hands. This is called "splinting." Doing this helps protect your incision. It also decreases belly discomfort.  If you are being admitted to the hospital overnight, leave your suitcase in the car. After surgery it may be brought to your room.  In case of increased patient census, it may be necessary for you, the patient, to continue your postoperative care in the Same Day Surgery department.  If you are being discharged the day of surgery, you will not be allowed to drive home. You will need a responsible individual to drive you home and stay with you for 24 hours after surgery.   If you are taking public transportation, you will need to have a responsible individual with you.  Please call the Pre-admissions Testing Dept. at [phone] if you have any questions about these instructions.  Surgery Visitation Policy:  Patients having surgery or a procedure may have two visitors.  Children under the age of 17 must have an adult with them who is not the patient.                                                                                                             Preparing for Surgery with CHLORHEXIDINE GLUCONATE (CHG) Soap  Chlorhexidine Gluconate (CHG) Soap  o An antiseptic cleaner that kills germs and bonds with the skin to continue killing germs even after washing  o Used for showering the night before surgery and morning of surgery  Before surgery, you can play an important role by reducing the number of germs on your skin.  CHG (Chlorhexidine gluconate) soap is an antiseptic cleanser which kills germs and bonds with the skin to continue killing germs even after washing.  Please do not use if you have an allergy to CHG or antibacterial soaps. If your skin becomes reddened/irritated stop using the CHG.  1. Shower the NIGHT BEFORE SURGERY with CHG soap.  2. If you choose to wash your hair, wash your hair first  as usual with your normal shampoo.  3. After shampooing, rinse your hair and body thoroughly to remove the shampoo.  4. Use CHG as you would any other liquid soap. You can apply CHG directly to the skin and wash gently with a clean washcloth.  5. Apply the CHG soap to your body only from the neck down. Do not use on open wounds or open sores. Avoid contact with your eyes, ears, mouth, and genitals (private parts). Wash face and genitals (private parts) with your normal soap.  6. Wash thoroughly, paying special attention to the area where your surgery will be performed.  7. Thoroughly rinse your body with warm water.  8. Do not shower/wash with your normal soap after using and rinsing off the CHG soap.  9. Do not use lotions, oils, etc., after showering with CHG.  10. Pat yourself dry with a clean towel.  11. Wear clean pajamas to bed the night before surgery.  12. Place clean sheets on your bed the night of your shower and do not sleep with pets.  13. Do not apply any deodorants/lotions/powders.  14. Please wear clean clothes to the hospital.  15. Remember to brush your teeth with your regular toothpaste.   Merchandiser, retail to address health-related social needs:  [email or web address]

## 2024-07-08 ENCOUNTER — Ambulatory Visit
Admission: RE | Admit: 2024-07-08 | Discharge: 2024-07-08 | Disposition: A | Discharge status: Home / Self Care | Payer: MEDICAID | Attending: Obstetrics and Gynecology | Admitting: Obstetrics and Gynecology

## 2024-07-08 ENCOUNTER — Ambulatory Visit: Payer: MEDICAID | Admitting: Certified Registered"

## 2024-07-08 ENCOUNTER — Encounter
Admission: RE | Disposition: A | Discharge status: Home / Self Care | Payer: Self-pay | Source: Home / Self Care | Attending: Obstetrics and Gynecology

## 2024-07-08 ENCOUNTER — Encounter: Payer: Self-pay | Admitting: Obstetrics and Gynecology

## 2024-07-08 ENCOUNTER — Other Ambulatory Visit: Payer: Self-pay

## 2024-07-08 DIAGNOSIS — F17210 Nicotine dependence, cigarettes, uncomplicated: Secondary | ICD-10-CM | POA: Insufficient documentation

## 2024-07-08 DIAGNOSIS — R1909 Other intra-abdominal and pelvic swelling, mass and lump: Secondary | ICD-10-CM | POA: Diagnosis not present

## 2024-07-08 DIAGNOSIS — F419 Anxiety disorder, unspecified: Secondary | ICD-10-CM | POA: Diagnosis not present

## 2024-07-08 DIAGNOSIS — N803C2 Endometriosis of the left uterosacral ligament, unspecified depth: Secondary | ICD-10-CM | POA: Diagnosis not present

## 2024-07-08 DIAGNOSIS — R1022 Pelvic and perineal pain left side: Secondary | ICD-10-CM | POA: Diagnosis not present

## 2024-07-08 DIAGNOSIS — Z7951 Long term (current) use of inhaled steroids: Secondary | ICD-10-CM | POA: Insufficient documentation

## 2024-07-08 DIAGNOSIS — K219 Gastro-esophageal reflux disease without esophagitis: Secondary | ICD-10-CM | POA: Diagnosis not present

## 2024-07-08 DIAGNOSIS — G8929 Other chronic pain: Secondary | ICD-10-CM | POA: Insufficient documentation

## 2024-07-08 DIAGNOSIS — N9489 Other specified conditions associated with female genital organs and menstrual cycle: Secondary | ICD-10-CM | POA: Diagnosis present

## 2024-07-08 DIAGNOSIS — J45909 Unspecified asthma, uncomplicated: Secondary | ICD-10-CM | POA: Insufficient documentation

## 2024-07-08 DIAGNOSIS — Z79899 Other long term (current) drug therapy: Secondary | ICD-10-CM | POA: Insufficient documentation

## 2024-07-08 DIAGNOSIS — F32A Depression, unspecified: Secondary | ICD-10-CM | POA: Diagnosis not present

## 2024-07-08 DIAGNOSIS — J4540 Moderate persistent asthma, uncomplicated: Secondary | ICD-10-CM

## 2024-07-08 DIAGNOSIS — N80329 Endometriosis of the posterior cul-de-sac, unspecified depth: Secondary | ICD-10-CM | POA: Diagnosis not present

## 2024-07-08 DIAGNOSIS — Z01818 Encounter for other preprocedural examination: Secondary | ICD-10-CM

## 2024-07-08 DIAGNOSIS — R1032 Left lower quadrant pain: Secondary | ICD-10-CM | POA: Diagnosis present

## 2024-07-08 HISTORY — PX: LAPAROSCOPY, DIAGNOSTIC, ROBOT-ASSISTED: SHX7606

## 2024-07-08 LAB — URINE DRUG SCREEN, QUALITATIVE (ARMC ONLY)
Amphetamines, Ur Screen: NOT DETECTED
Barbiturates, Ur Screen: NOT DETECTED
Benzodiazepine, Ur Scrn: NOT DETECTED
Cannabinoid 50 Ng, Ur ~~LOC~~: NOT DETECTED
Cocaine Metabolite,Ur ~~LOC~~: NOT DETECTED
MDMA (Ecstasy)Ur Screen: NOT DETECTED
Methadone Scn, Ur: NOT DETECTED
Opiate, Ur Screen: NOT DETECTED
Phencyclidine (PCP) Ur S: NOT DETECTED
Tricyclic, Ur Screen: NOT DETECTED

## 2024-07-08 LAB — PREGNANCY, URINE: Preg Test, Ur: NEGATIVE

## 2024-07-08 LAB — POCT PREGNANCY, URINE: Preg Test, Ur: NEGATIVE

## 2024-07-08 SURGERY — LAPAROSCOPY, DIAGNOSTIC, ROBOT-ASSISTED
Anesthesia: General | Site: Pelvis

## 2024-07-08 MED ORDER — ACETAMINOPHEN 10 MG/ML IV SOLN
INTRAVENOUS | Status: AC
Start: 2024-07-08 — End: 2024-07-08
  Filled 2024-07-08: qty 100

## 2024-07-08 MED ORDER — ROCURONIUM BROMIDE 100 MG/10ML IV SOLN
INTRAVENOUS | Status: DC | PRN
  Administered 2024-07-08: 20 mg via INTRAVENOUS
  Administered 2024-07-08 (×2): 30 mg via INTRAVENOUS
  Administered 2024-07-08: 20 mg via INTRAVENOUS
  Administered 2024-07-08: 50 mg via INTRAVENOUS

## 2024-07-08 MED ORDER — MIDAZOLAM HCL 2 MG/2ML IJ SOLN
INTRAMUSCULAR | Status: AC
  Filled 2024-07-08: qty 2

## 2024-07-08 MED ORDER — HYDROMORPHONE HCL 1 MG/ML IJ SOLN
INTRAMUSCULAR | Status: AC
  Filled 2024-07-08: qty 1

## 2024-07-08 MED ORDER — FENTANYL CITRATE (PF) 100 MCG/2ML IJ SOLN
25.0000 ug | INTRAMUSCULAR | Status: DC | PRN
  Administered 2024-07-08: 25 ug via INTRAVENOUS

## 2024-07-08 MED ORDER — MIDAZOLAM HCL (PF) 2 MG/2ML IJ SOLN
INTRAMUSCULAR | Status: DC | PRN
  Administered 2024-07-08 (×2): 2 mg via INTRAVENOUS

## 2024-07-08 MED ORDER — PROPOFOL 500 MG/50ML IV EMUL
INTRAVENOUS | Status: DC | PRN
  Administered 2024-07-08: 145 ug/kg/min via INTRAVENOUS

## 2024-07-08 MED ORDER — OXYCODONE HCL 5 MG PO TABS
ORAL_TABLET | ORAL | Status: AC
  Filled 2024-07-08: qty 1

## 2024-07-08 MED ORDER — LACTATED RINGERS IV SOLN: INTRAVENOUS | Status: DC

## 2024-07-08 MED ORDER — SUGAMMADEX SODIUM 200 MG/2ML IV SOLN
INTRAVENOUS | Status: DC | PRN
  Administered 2024-07-08: 200 mg via INTRAVENOUS

## 2024-07-08 MED ORDER — OXYCODONE-ACETAMINOPHEN 5-325 MG PO TABS: 1.0000 | ORAL_TABLET | Freq: Four times a day (QID) | ORAL | 0 refills | Status: AC | PRN

## 2024-07-08 MED ORDER — HYDROMORPHONE HCL 1 MG/ML IJ SOLN
INTRAMUSCULAR | Status: DC | PRN
  Administered 2024-07-08: 1 mg via INTRAVENOUS

## 2024-07-08 MED ORDER — MIDAZOLAM HCL 2 MG/2ML IJ SOLN
INTRAMUSCULAR | Status: AC
Start: 2024-07-08 — End: 2024-07-08
  Filled 2024-07-08: qty 2

## 2024-07-08 MED ORDER — PROPOFOL 10 MG/ML IV BOLUS
INTRAVENOUS | Status: DC | PRN
  Administered 2024-07-08: 200 mg via INTRAVENOUS

## 2024-07-08 MED ORDER — HEMOSTATIC AGENTS (NO CHARGE) OPTIME
TOPICAL | Status: DC | PRN
  Administered 2024-07-08: 1

## 2024-07-08 MED ORDER — PHENYLEPHRINE 80 MCG/ML (10ML) SYRINGE FOR IV PUSH (FOR BLOOD PRESSURE SUPPORT)
PREFILLED_SYRINGE | INTRAVENOUS | Status: DC | PRN
  Administered 2024-07-08 (×2): 80 ug via INTRAVENOUS

## 2024-07-08 MED ORDER — ONDANSETRON 4 MG PO TBDP: 4.0000 mg | ORAL_TABLET | Freq: Four times a day (QID) | ORAL | 0 refills | Status: AC | PRN

## 2024-07-08 MED ORDER — FENTANYL CITRATE (PF) 100 MCG/2ML IJ SOLN
INTRAMUSCULAR | Status: AC
  Filled 2024-07-08: qty 2

## 2024-07-08 MED ORDER — BUPIVACAINE HCL (PF) 0.5 % IJ SOLN
INTRAMUSCULAR | Status: AC
  Filled 2024-07-08: qty 30

## 2024-07-08 MED ORDER — PROPOFOL 1000 MG/100ML IV EMUL
INTRAVENOUS | Status: AC
  Filled 2024-07-08: qty 100

## 2024-07-08 MED ORDER — SODIUM CHLORIDE 0.9 % IR SOLN
Status: DC | PRN
  Administered 2024-07-08: 1000 mL

## 2024-07-08 MED ORDER — METHYLENE BLUE (ANTIDOTE) 1 % IV SOLN
INTRAVENOUS | Status: AC
  Filled 2024-07-08: qty 10

## 2024-07-08 MED ORDER — EPHEDRINE SULFATE-NACL 50-0.9 MG/10ML-% IV SOSY
PREFILLED_SYRINGE | INTRAVENOUS | Status: DC | PRN
  Administered 2024-07-08 (×2): 5 mg via INTRAVENOUS

## 2024-07-08 MED ORDER — LIDOCAINE HCL (CARDIAC) PF 100 MG/5ML IV SOSY
PREFILLED_SYRINGE | INTRAVENOUS | Status: DC | PRN
  Administered 2024-07-08: 100 mg via INTRAVENOUS

## 2024-07-08 MED ORDER — 0.9 % SODIUM CHLORIDE (POUR BTL) OPTIME
TOPICAL | Status: DC | PRN
  Administered 2024-07-08: 500 mL

## 2024-07-08 MED ORDER — OXYCODONE HCL 5 MG PO TABS
5.0000 mg | ORAL_TABLET | Freq: Once | ORAL | Status: AC | PRN
  Administered 2024-07-08: 5 mg via ORAL

## 2024-07-08 MED ORDER — BUPIVACAINE HCL 0.5 % IJ SOLN
INTRAMUSCULAR | Status: DC | PRN
  Administered 2024-07-08: 10 mL

## 2024-07-08 MED ORDER — ORAL CARE MOUTH RINSE: 15.0000 mL | Freq: Once | OROMUCOSAL | Status: AC

## 2024-07-08 MED ORDER — DROPERIDOL 2.5 MG/ML IJ SOLN: 0.6250 mg | Freq: Once | INTRAMUSCULAR | Status: DC | PRN

## 2024-07-08 MED ORDER — ACETAMINOPHEN 10 MG/ML IV SOLN: 1000.0000 mg | Freq: Once | INTRAVENOUS | Status: DC | PRN

## 2024-07-08 MED ORDER — DEXMEDETOMIDINE HCL IN NACL 200 MCG/50ML IV SOLN
INTRAVENOUS | Status: DC | PRN
  Administered 2024-07-08 (×2): 20 ug via INTRAVENOUS

## 2024-07-08 MED ORDER — OXYCODONE HCL 5 MG/5ML PO SOLN: 5.0000 mg | Freq: Once | ORAL | Status: AC | PRN

## 2024-07-08 MED ORDER — IBUPROFEN 600 MG PO TABS: 600.0000 mg | ORAL_TABLET | Freq: Four times a day (QID) | ORAL | 0 refills | Status: AC

## 2024-07-08 MED ORDER — LACTATED RINGERS IV SOLN: INTRAVENOUS | Status: AC

## 2024-07-08 MED ORDER — CHLORHEXIDINE GLUCONATE 0.12 % MT SOLN
OROMUCOSAL | Status: AC
  Filled 2024-07-08: qty 15

## 2024-07-08 MED ORDER — FENTANYL CITRATE (PF) 100 MCG/2ML IJ SOLN
INTRAMUSCULAR | Status: DC | PRN
  Administered 2024-07-08 (×2): 50 ug via INTRAVENOUS

## 2024-07-08 MED ORDER — PROPOFOL 1000 MG/100ML IV EMUL
INTRAVENOUS | Status: AC
Start: 2024-07-08 — End: 2024-07-08
  Filled 2024-07-08: qty 100

## 2024-07-08 MED ORDER — DEXAMETHASONE SOD PHOSPHATE PF 10 MG/ML IJ SOLN
INTRAMUSCULAR | Status: DC | PRN
  Administered 2024-07-08: 10 mg via INTRAVENOUS

## 2024-07-08 MED ORDER — ONDANSETRON HCL 4 MG/2ML IJ SOLN
INTRAMUSCULAR | Status: DC | PRN
  Administered 2024-07-08 (×2): 4 mg via INTRAVENOUS

## 2024-07-08 MED ORDER — SILVER NITRATE-POT NITRATE 75-25 % EX MISC
CUTANEOUS | Status: DC | PRN
  Administered 2024-07-08 (×2): 2

## 2024-07-08 MED ORDER — ACETAMINOPHEN 10 MG/ML IV SOLN
INTRAVENOUS | Status: DC | PRN
Start: 2024-07-08 — End: 2024-07-08
  Administered 2024-07-08: 1000 mg via INTRAVENOUS

## 2024-07-08 MED ORDER — GLYCOPYRROLATE 0.2 MG/ML IJ SOLN
INTRAMUSCULAR | Status: DC | PRN
  Administered 2024-07-08: .2 mg via INTRAVENOUS

## 2024-07-08 MED ORDER — CHLORHEXIDINE GLUCONATE 0.12 % MT SOLN
15.0000 mL | Freq: Once | OROMUCOSAL | Status: AC
  Administered 2024-07-08: 15 mL via OROMUCOSAL

## 2024-07-08 SURGICAL SUPPLY — 75 items
ANCHOR TIS RET SYS 235ML (MISCELLANEOUS) ×2 IMPLANT
BAG PRESSURE INF REUSE 1000 (BAG) ×2 IMPLANT
BAG URINE DRAIN 2000ML AR STRL (UROLOGICAL SUPPLIES) ×4 IMPLANT
BARRIER ADHS 3X4 INTERCEED (GAUZE/BANDAGES/DRESSINGS) ×2 IMPLANT
BLADE SURG SZ11 CARB STEEL (BLADE) ×6 IMPLANT
CATH URTH 16FR FL 2W BLN LF (CATHETERS) ×4 IMPLANT
CHLORAPREP W/TINT 26 (MISCELLANEOUS) ×2 IMPLANT
COVER TIP SHEARS 8 DVNC (MISCELLANEOUS) ×4 IMPLANT
COVER WAND RF STERILE (DRAPES) ×4 IMPLANT
DEFOGGER SCOPE WARM SEASHARP (MISCELLANEOUS) ×4 IMPLANT
DERMABOND ADVANCED .7 DNX12 (GAUZE/BANDAGES/DRESSINGS) ×4 IMPLANT
DRAPE ARM DVNC X/XI (DISPOSABLE) ×12 IMPLANT
DRAPE COLUMN DVNC XI (DISPOSABLE) ×4 IMPLANT
DRAPE LEGGINS SURG 28X43 STRL (DRAPES) ×4 IMPLANT
DRAPE SHEET LG 3/4 BI-LAMINATE (DRAPES) ×8 IMPLANT
DRAPE UNDER BUTTOCK W/FLU (DRAPES) ×4 IMPLANT
DRESSING SURGICEL FIBRLLR 1X2 (HEMOSTASIS) ×2 IMPLANT
DRSG TELFA 3X8 NADH STRL (GAUZE/BANDAGES/DRESSINGS) IMPLANT
ELECTRODE REM PT RTRN 9FT ADLT (ELECTROSURGICAL) ×4 IMPLANT
FORCEPS BPLR 8 MD DVNC XI (FORCEP) ×4 IMPLANT
FORCEPS BPLR FENES DVNC XI (FORCEP) ×2 IMPLANT
FORCEPS BPLR R/ABLATION 8 DVNC (INSTRUMENTS) ×4 IMPLANT
FORCEPS TENACULUM DVNC XI (FORCEP) ×2 IMPLANT
GLOVE BIO SURGEON STRL SZ7 (GLOVE) ×8 IMPLANT
GLOVE BIOGEL PI IND STRL 7.5 (GLOVE) ×12 IMPLANT
GLOVE INDICATOR 7.5 STRL GRN (GLOVE) ×4 IMPLANT
GOWN STRL REUS W/ TWL LRG LVL3 (GOWN DISPOSABLE) ×12 IMPLANT
GOWN STRL REUS W/ TWL XL LVL3 (GOWN DISPOSABLE) IMPLANT
GRASPER SUT TROCAR 14GX15 (MISCELLANEOUS) ×2 IMPLANT
IRRIGATION STRYKERFLOW (MISCELLANEOUS) IMPLANT
IRRIGATOR SUCT 8 DISP DVNC XI (IRRIGATION / IRRIGATOR) ×2 IMPLANT
IV 0.9% NACL 1000 ML (IV SOLUTION) ×4 IMPLANT
IV LR 1000 ML (IV SOLUTION) ×2 IMPLANT
KIT IMAGING PINPOINTPAQ (MISCELLANEOUS) IMPLANT
KIT PINK PAD W/HEAD ARM REST (MISCELLANEOUS) ×4 IMPLANT
KIT TURNOVER CYSTO (KITS) ×4 IMPLANT
LABEL OR SOLS (LABEL) ×4 IMPLANT
LIGASURE VESSEL 5MM BLUNT TIP (ELECTROSURGICAL) IMPLANT
MANIFOLD NEPTUNE II (INSTRUMENTS) ×4 IMPLANT
MANIPULATOR UTERINE 4.5 ZUMI (MISCELLANEOUS) ×2 IMPLANT
NDL HYPO 22X1.5 SAFETY MO (MISCELLANEOUS) ×2 IMPLANT
NEEDLE HYPO 22X1.5 SAFETY MO (MISCELLANEOUS) ×4 IMPLANT
NS IRRIG 500ML POUR BTL (IV SOLUTION) ×4 IMPLANT
OBTURATOR OPTICALSTD 8 DVNC (TROCAR) ×4 IMPLANT
PACK DNC HYST (MISCELLANEOUS) ×2 IMPLANT
PACK LAP CHOLECYSTECTOMY (MISCELLANEOUS) ×4 IMPLANT
PAD ARMBOARD POSITIONER FOAM (MISCELLANEOUS) ×2 IMPLANT
PAD OB MATERNITY 11 LF (PERSONAL CARE ITEMS) ×4 IMPLANT
PAD PREP OB/GYN DISP 24X41 (PERSONAL CARE ITEMS) ×4 IMPLANT
SCISSORS LAP 5X35 DISP (ENDOMECHANICALS) IMPLANT
SCISSORS MNPLR CVD DVNC XI (INSTRUMENTS) ×4 IMPLANT
SCRUB CHG 4% DYNA-HEX 4OZ (MISCELLANEOUS) ×4 IMPLANT
SEAL UNIV 5-12 XI (MISCELLANEOUS) ×10 IMPLANT
SEALER VESSEL EXT DVNC XI (MISCELLANEOUS) IMPLANT
SET CYSTO IRRIGATION (SET/KITS/TRAYS/PACK) IMPLANT
SET TUBE SMOKE EVAC HIGH FLOW (TUBING) ×4 IMPLANT
SHEARS HARMONIC 36 ACE (MISCELLANEOUS) IMPLANT
SLEEVE Z-THREAD 5X100MM (TROCAR) ×4 IMPLANT
SOLN 0.9% NACL 1000 ML (IV SOLUTION) IMPLANT
SOLN 0.9% NACL POUR BTL 1000ML (IV SOLUTION) ×4 IMPLANT
SOLUTION ELECTROSURG ANTI STCK (MISCELLANEOUS) ×4 IMPLANT
SOLUTION SCRB POV-IOD 4OZ 7.5% (MISCELLANEOUS) ×4 IMPLANT
SPONGE T-LAP 18X18 ~~LOC~~+RFID (SPONGE) IMPLANT
SURGILUBE 2OZ TUBE FLIPTOP (MISCELLANEOUS) ×4 IMPLANT
SUT VIC AB 2-0 UR6 27 (SUTURE) ×2 IMPLANT
SUT VICRYL 0 UR6 27IN ABS (SUTURE) ×2 IMPLANT
SUTURE MNCRL 4-0 27XMF (SUTURE) ×6 IMPLANT
SYR 10ML LL (SYRINGE) ×4 IMPLANT
TOWEL OR 17X26 4PK STRL BLUE (TOWEL DISPOSABLE) ×4 IMPLANT
TRAP FLUID SMOKE EVACUATOR (MISCELLANEOUS) ×2 IMPLANT
TROCAR Z THRD FIOS 12X150 (TROCAR) IMPLANT
TROCAR Z-THREAD FIOS 12X100MM (TROCAR) IMPLANT
TROCAR Z-THREAD FIOS 5X100MM (TROCAR) ×2 IMPLANT
TUBING EVAC SMOKE HEATED PNEUM (TUBING) ×4 IMPLANT
WATER STERILE IRR 500ML POUR (IV SOLUTION) ×2 IMPLANT

## 2024-07-08 NOTE — Anesthesia Procedure Notes (Signed)
 Procedure Name: Intubation Date/Time: 07/08/2024 2:24 PM  Performed by: Ledora Duncan, CRNAPre-anesthesia Checklist: Patient identified, Emergency Drugs available, Suction available and Patient being monitored Patient Re-evaluated:Patient Re-evaluated prior to induction Oxygen Delivery Method: Circle system utilized Preoxygenation: Pre-oxygenation with 100% oxygen Induction Type: IV induction Ventilation: Mask ventilation without difficulty Laryngoscope Size: McGrath and 3 Grade View: Grade I Tube type: Oral Tube size: 6.5 mm Number of attempts: 1 Airway Equipment and Method: Stylet Placement Confirmation: ETT inserted through vocal cords under direct vision, positive ETCO2 and breath sounds checked- equal and bilateral Secured at: 21 cm Tube secured with: Tape Dental Injury: Teeth and Oropharynx as per pre-operative assessment

## 2024-07-08 NOTE — Op Note (Signed)
 Operative Note    Name: Karen Villa  Date of Service: 07/08/2024  DOB: 25-Jun-2001  MRN: [id number]   Pre-Operative Diagnosis:  1) Chronic pelvic pain 2) Left adnexal mass  Post-Operative Diagnosis:  1) Chronic pelvic pain 2) Left broad ligament mass 3) Endometriosis  Procedures:  1) Robot assisted removal of left broad ligament mass 2) Excision of endometriosis implants  Primary Surgeon: Garnette Mace, MD   EBL: 15 mL   IVF: 1,000 mL   Urine output: 200 mL  Specimens:  1) Left broad ligament mass (in 2 pieces) 2) Left ovarian fossa and uterosacral peritoneum biopsy 3) Posterior cul-de-sac peritoneum biopsy  Drains: None  Complications: None   Disposition: PACU   Condition: Stable   Findings:  1) enlarged left broad ligament mass just distal to left round ligament and abutting left lateral uterine sidewall. 2) powder burn and nodular lesions along left ovarian fossa, left uterosacral ligament, and posterior cul-de-sac  Procedure Summary:  The patient was taken to the operating room where general anesthesia was administered and found to be adequate. She was placed in the dorsal supine lithotomy position in [place] stirrups and prepped and draped in usual sterile fashion. After a timeout was called an indwelling catheter was placed in her bladder.  A sterile speculum was placed in the vagina.  A single-tooth tenaculum was used to grasp the anterior lip of the cervix.  The cervix was dilated to a 15 Hegar dilator.  The ZUMI uterine manipulator was placed without difficulty.  The speculum was removed.  Attention was turned to the abdomen where after injection of local anesthetic, an 8 mm infraumbilical incision was made with the scalpel. Entry into the abdomen was obtained via Optiview trocar technique (a blunt entry technique with camera visualization through the obturator upon entry). Verification of entry into the abdomen was obtained using opening pressures. The  abdomen was insufflated with CO2. The camera was introduced through the trocar with verification of atraumatic entry.  Right and left abdominal entry sites were created after injection of local anesthetic about 8 cm away from the umbilical port in accordance with the Intuitive manufacturer's recommendations.  The port sites were 8 mm.  The intuitive trochars were introduced under intra-abdominal camera visualization without difficulty.  The XI robot was docked on the patient's left.  Clearance was verified from the patient's legs.  Through the umbilical port the camera was placed.  Through the port attached to arm 4 the monopolar scissors were placed.  Through the port attached to arm 2 the bipolar fenestrated forceps were was placed.    After a survey of the pelvis was undertaken with the above-noted findings, the broad ligament posterior to the round ligament on the left side was opened.  Initially, the mass appeared to be about equally posterior and anterior to the round ligament.  However after dissection the mass appeared to be more anterior.  The broad ligament was opened anterior to the round ligament in a lateral fashion and a mass was noted that was similar and appearance to a fibroid.  The broad ligament excision was extended inferiorly by about 2 cm.  The mass was grasped with the single-tooth tenaculum and elevated and resected away from the underlying peritoneum.  Hemostasis was attained after removal.  The specimen was removed in 2 pieces and placed in the posterior cul-de-sac.  The left ovarian fossa was tented up and sharply transected with the scissors.  This was performed after verifying the location of  the ureter.  The peritoneum was carefully excised away from the underlying connective tissue until all the nodular areas and powder burn lesions were removed.  This area included the left uterosacral ligament.  The specimen was handed off through the robotic port.  The posterior cul-de-sac  peritoneum was gently tented up and entered sharply.  A circumferential excision was performed after separating the peritoneum from the underlying connective tissue.  Hemostasis was attained in each area.  The ureter was visualized to be unaffected by the excision and was peristalsing.  The intra-abdominal pressure was lowered to 5 mmHg and hemostasis was noted at all excision sites.  Fibrillar was placed in the defect in the broad ligament and both incision sites.  Interceed was placed over the peritoneal excision sites.  The robot instruments were removed.  The robot was undocked and moved from the patient bedside.  The 8 mm infraumbilical trocar was removed and the fascia was extended gently using Kelly forceps.  The specimen retrieval bag was passed directly through the skin into the abdominal cavity and both specimens were placed into the retrieval bag.  The retrieval bag was brought to the incision and were able to be removed intact inside the bag.  The fascia was reapproximated at the infraumbilical incision using 0 Vicryl in a single interrupted stitch.  The abdomen was emptied of CO2.  The trocars were removed.  The skin was closed using 4-0 Monocryl and reinforced using surgical skin glue.  The Foley catheter was removed.  The ZUMI manipulator was removed and hemostasis was obtained at the tenaculum entry site using silver nitrate.  The vagina was verified to be empty of sponges and instruments at the end of the case.  The patient tolerated the procedure well.  Sponge, lap, needle, and instrument counts were correct x 2.  VTE prophylaxis: SCDs. Antibiotic prophylaxis: none indicated. She was awakened in the operating room and was taken to the PACU in stable condition.   Garnette Mace, MD 07/08/2024 5:24 PM

## 2024-07-08 NOTE — Anesthesia Preprocedure Evaluation (Signed)
 Anesthesia Evaluation  Patient identified by MRN, date of birth, ID band Patient awake    Reviewed: Allergy & Precautions, H&P , NPO status , Patient's Chart, lab work & pertinent test results, reviewed documented beta blocker date and time   Airway Mallampati: II  TM Distance: >3 FB Neck ROM: full    Dental  (+) Teeth Intact   Pulmonary neg shortness of breath, asthma , Current Smoker and Patient abstained from smoking.   Pulmonary exam normal        Cardiovascular Exercise Tolerance: Good negative cardio ROS Normal cardiovascular exam Rhythm:regular Rate:Normal     Neuro/Psych  PSYCHIATRIC DISORDERS Anxiety Depression    negative neurological ROS     GI/Hepatic Neg liver ROS,GERD  Medicated,,  Endo/Other  negative endocrine ROS    Renal/GU negative Renal ROS  negative genitourinary   Musculoskeletal   Abdominal   Peds  Hematology negative hematology ROS (+)   Anesthesia Other Findings Past Medical History: No date: ADHD No date: Adnexal mass No date: Allergy No date: Anxiety No date: Asthma No date: Chronic pelvic pain in female No date: Cigarette smoker No date: Cocaine abuse (HCC)     Comment:  last use week of 06-23-24 per pt No date: Depression No date: GERD (gastroesophageal reflux disease) No date: History of suicide attempt No date: Marijuana abuse No date: OCD (obsessive compulsive disorder) Past Surgical History: No date: NO PAST SURGERIES BMI    Body Mass Index: 25.84 kg/m     Reproductive/Obstetrics negative OB ROS                              Anesthesia Physical Anesthesia Plan  ASA: 2  Anesthesia Plan: General ETT   Post-op Pain Management:    Induction:   PONV Risk Score and Plan: 3  Airway Management Planned:   Additional Equipment:   Intra-op Plan:   Post-operative Plan:   Informed Consent: I have reviewed the patients History and Physical,  chart, labs and discussed the procedure including the risks, benefits and alternatives for the proposed anesthesia with the patient or authorized representative who has indicated his/her understanding and acceptance.     Dental Advisory Given  Plan Discussed with: CRNA  Anesthesia Plan Comments:         Anesthesia Quick Evaluation

## 2024-07-08 NOTE — Transfer of Care (Signed)
 Immediate Anesthesia Transfer of Care Note  Patient: Karen Villa  Procedure(s) Performed: LAPAROSCOPY, DIAGNOSTIC, ROBOT-ASSISTED (Pelvis) EXCISION, ENDOMETRIOSIS, LAPAROSCOPIC (Pelvis) EXCISION, MASS, ABDOMEN, ROBOT-ASSISTED, LAPAROSCOPIC Mass excision Left Broad Ligament (Left: Abdomen)  Patient Location: PACU  Anesthesia Type:General  Level of Consciousness: drowsy and patient cooperative  Airway & Oxygen Therapy: Patient Spontanous Breathing and Patient connected to face mask oxygen  Post-op Assessment: Report given to RN and Post -op Vital signs reviewed and stable  Post vital signs: Reviewed and stable  Last Vitals:  Vitals Value Taken Time  BP 105/55 07/08/24 17:30  Temp    Pulse 76 07/08/24 17:35  Resp 17 07/08/24 17:35  SpO2 99 % 07/08/24 17:35  Vitals shown include unfiled device data.  Last Pain:  Vitals:   07/08/24 1246  TempSrc: Temporal  PainSc: 0-No pain         Complications: No notable events documented.
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
## 2024-07-09 ENCOUNTER — Encounter: Payer: Self-pay | Admitting: Obstetrics and Gynecology

## 2024-07-12 LAB — SURGICAL PATHOLOGY

## 2024-07-12 NOTE — Anesthesia Postprocedure Evaluation (Signed)
 Anesthesia Post Note  Patient: Ronisha L Hargens  Procedure(s) Performed: LAPAROSCOPY, DIAGNOSTIC, ROBOT-ASSISTED (Pelvis) EXCISION, ENDOMETRIOSIS, LAPAROSCOPIC (Pelvis) EXCISION, MASS, ABDOMEN, ROBOT-ASSISTED, LAPAROSCOPIC Mass excision Left Broad Ligament (Left: Abdomen)  Patient location during evaluation: PACU Anesthesia Type: General Level of consciousness: awake and alert Pain management: pain level controlled Vital Signs Assessment: post-procedure vital signs reviewed and stable Respiratory status: spontaneous breathing, nonlabored ventilation and respiratory function stable Cardiovascular status: blood pressure returned to baseline and stable Postop Assessment: no apparent nausea or vomiting Anesthetic complications: no   No notable events documented.   Last Vitals:  Vitals:   07/08/24 1808 07/08/24 1815  BP:  102/67  Pulse: 76 67  Resp: 16 18  Temp:  (!) 36.2 C  SpO2: 97% 97%    Last Pain:  Vitals:   07/08/24 1815  TempSrc: Temporal  PainSc: 3                  Camellia Merilee Louder

## 2024-10-11 ENCOUNTER — Ambulatory Visit: Payer: Self-pay

## 2024-10-11 NOTE — Telephone Encounter (Signed)
 FYI Only or Action Required?: FYI only for provider: appointment scheduled on 10/12/2024 at 11am with patient's PCP Toribio Hoyle PA.  Patient was last seen in primary care on 02/10/2024 by Hoyle Toribio SQUIBB, PA.  Called Nurse Triage reporting Sore Throat.  Symptoms began 2-3 weeks ago but possible lymph node swelling started a few days ago.  Interventions attempted: OTC medications: Nyquil and  and Rest, hydration, or home remedies.  Symptoms are: slightly better but lingering and now having swelling/tenderness in neck where patient suspects swollen lymph nodes.  Triage Disposition: See Physician Within 24 Hours  Patient/caregiver understands and will follow disposition?: Yes        Message from Clayton R sent at 10/11/2024  3:37 PM EST  Right side of neck and mouth swollen- believes its swollen lymph nodes, congestion   Reason for Disposition  [1] Tender node in the neck AND [2] also has a sore throat AND [3] minimal/no runny nose or cough  Answer Assessment - Initial Assessment Questions Patient called and states that she started with cold-like symptoms about three weeks ago She states that the right side of her neck seems swollen and the right side of the roof of her mouth seems swollen as well---she states her neck is not visibly swollen but she can feel what she thinks might be swollen lymph nodes Slight sore throat as well---at night mostly  Patient denies dental issues Patient denies difficulty breathing or fevers or difficulty swallowing  Still coughing up some greenish mucous---lingering Patient states no white or red patches in her mouth  Patient states her cycle was at the end of last month and should be starting at the end of this month---she states not 100% sure about no chance of pregnancy at this time  Neck area is tender to palpation  Patient has been taking Nyquil and Tylenol   Patient states that the swelling in the right side of her mouth has improved  but she wanted to be seen due to her symptoms lingering for so long She is okay with waiting until tomorrow to see her provider  Patient is advised to call us  back if anything changes or with any further questions/concerns. Patient is advised that if anything worsens to go to the Emergency Room. Patient verbalized understanding.  Answer Assessment - Initial Assessment Questions Patient called and states that she started with cold-like symptoms about three weeks ago She states that the right side of her neck seems swollen and the right side of the roof of her mouth seems swollen as well---she states her neck is not visibly swollen but she can feel what she thinks might be a swollen lymph node Slight sore throat as well---at night mostly Patient denies dental issues Patient denies difficulty breathing or fevers or difficulty swallowing  Still coughing up some greenish mucous---lingering Patient states no white or red patches in her mouth  Patient states her cycle was at the end of last month and should be starting at the end of this month---she states not 100% sure about no chance of pregnancy at this time  Neck area is tender to palpation  Patient has been taking Nyquil and Tylenol   Patient states that the swelling in the right side of her mouth has improved but she wanted to be seen due to her symptoms lingering for so long She is okay with waiting until tomorrow to see her provider  Patient is advised to call us  back if anything changes or with any further questions/concerns. Patient  is advised that if anything worsens to go to the Emergency Room. Patient verbalized understanding.  Protocols used: Lymph Nodes - Swollen-A-AH, Sore Throat-A-AH

## 2024-10-11 NOTE — Telephone Encounter (Signed)
 Noted  Pt has appt.  KP

## 2024-10-12 ENCOUNTER — Ambulatory Visit: Payer: MEDICAID | Admitting: Physician Assistant

## 2024-10-12 ENCOUNTER — Ambulatory Visit: Payer: Self-pay

## 2024-10-12 ENCOUNTER — Encounter: Payer: Self-pay | Admitting: Physician Assistant

## 2024-10-12 NOTE — Telephone Encounter (Signed)
 Pt no showed appt.  KP

## 2024-10-12 NOTE — Telephone Encounter (Signed)
" °  Patient ended the line before warm transfer . Will try outbound call to call back now.  Called patient left message requesting call back . Attempted to call again no answer   Per chart review triaged yesterday was scheduled for appointment  10/12/2024 at 11am with patient's PCP Toribio Hoyle PA.     Message from [place] B sent at 10/12/2024 10:42 AM EST  Reason for Triage: Pt needs to r/s appt   "

## 2024-10-12 NOTE — Telephone Encounter (Signed)
 Unable to reach patient for triage x 3 attempts.

## 2024-10-12 NOTE — Telephone Encounter (Signed)
 This RN made second attempt to triage patient. No answer, LVM. Routing for additional attempts.

## 2024-10-28 ENCOUNTER — Telehealth: Payer: Self-pay | Admitting: Physician Assistant

## 2024-10-28 NOTE — Telephone Encounter (Signed)
 Copied from CRM #[id number]. Topic: Appointments - Scheduling Inquiry for Clinic >> Oct 28, 2024  3:25 PM Dars[place] M wrote: Reason for CRM: Patient received letter regarding no-show on 10/12/2024. Patient reporting she did call in and cancel appointment one hour before the appointment. Patient reporting she spoke with Joesph. [phone]

## 2024-10-28 NOTE — Telephone Encounter (Signed)
 Called pt left VM that she did not get charged for the visit. Pt needs to call back to schedule an appt.  KP
